# Patient Record
Sex: Female | Born: 1950 | Race: White | Hispanic: No | Marital: Married | Smoking: Former smoker
Health system: Southern US, Community
[De-identification: ages and names within clinical notes are randomized; demographics above are authoritative.]

## PROBLEM LIST (undated history)

## (undated) DIAGNOSIS — E785 Hyperlipidemia, unspecified: Secondary | ICD-10-CM

## (undated) DIAGNOSIS — I10 Essential (primary) hypertension: Secondary | ICD-10-CM

## (undated) DIAGNOSIS — E039 Hypothyroidism, unspecified: Secondary | ICD-10-CM

## (undated) DIAGNOSIS — M810 Age-related osteoporosis without current pathological fracture: Secondary | ICD-10-CM

## (undated) DIAGNOSIS — F419 Anxiety disorder, unspecified: Secondary | ICD-10-CM

## (undated) DIAGNOSIS — F32A Depression, unspecified: Secondary | ICD-10-CM

## (undated) DIAGNOSIS — Z9289 Personal history of other medical treatment: Secondary | ICD-10-CM

## (undated) DIAGNOSIS — F329 Major depressive disorder, single episode, unspecified: Secondary | ICD-10-CM

## (undated) DIAGNOSIS — E559 Vitamin D deficiency, unspecified: Secondary | ICD-10-CM

## (undated) HISTORY — DX: Vitamin D deficiency, unspecified: E55.9

## (undated) HISTORY — DX: Anxiety disorder, unspecified: F41.9

## (undated) HISTORY — PX: BACK SURGERY: SHX140

## (undated) HISTORY — DX: Essential (primary) hypertension: I10

## (undated) HISTORY — DX: Hypothyroidism, unspecified: E03.9

## (undated) HISTORY — DX: Age-related osteoporosis without current pathological fracture: M81.0

## (undated) HISTORY — DX: Major depressive disorder, single episode, unspecified: F32.9

## (undated) HISTORY — DX: Depression, unspecified: F32.A

## (undated) HISTORY — DX: Personal history of other medical treatment: Z92.89

## (undated) HISTORY — DX: Hyperlipidemia, unspecified: E78.5

---

## 2002-07-04 ENCOUNTER — Encounter: Payer: Self-pay | Admitting: Family Medicine

## 2002-07-04 ENCOUNTER — Encounter: Admission: RE | Admit: 2002-07-04 | Discharge: 2002-07-04 | Payer: Self-pay | Admitting: Family Medicine

## 2004-11-08 ENCOUNTER — Inpatient Hospital Stay (HOSPITAL_COMMUNITY): Admission: EM | Admit: 2004-11-08 | Discharge: 2004-11-10 | Payer: Self-pay | Admitting: Emergency Medicine

## 2005-08-14 ENCOUNTER — Other Ambulatory Visit: Admission: RE | Admit: 2005-08-14 | Discharge: 2005-08-14 | Payer: Self-pay | Admitting: Family Medicine

## 2005-12-06 ENCOUNTER — Encounter: Admission: RE | Admit: 2005-12-06 | Discharge: 2005-12-06 | Payer: Self-pay | Admitting: Family Medicine

## 2007-01-23 ENCOUNTER — Other Ambulatory Visit: Admission: RE | Admit: 2007-01-23 | Discharge: 2007-01-23 | Payer: Self-pay | Admitting: Family Medicine

## 2008-03-10 ENCOUNTER — Encounter: Admission: RE | Admit: 2008-03-10 | Discharge: 2008-03-10 | Payer: Self-pay | Admitting: Family Medicine

## 2008-04-22 ENCOUNTER — Other Ambulatory Visit: Admission: RE | Admit: 2008-04-22 | Discharge: 2008-04-22 | Payer: Self-pay | Admitting: Family Medicine

## 2009-09-01 ENCOUNTER — Other Ambulatory Visit: Admission: RE | Admit: 2009-09-01 | Discharge: 2009-09-01 | Payer: Self-pay | Admitting: Family Medicine

## 2010-03-02 ENCOUNTER — Emergency Department (HOSPITAL_COMMUNITY): Admission: EM | Admit: 2010-03-02 | Discharge: 2010-03-03 | Payer: Self-pay | Admitting: Emergency Medicine

## 2010-04-24 ENCOUNTER — Emergency Department (HOSPITAL_COMMUNITY): Admission: EM | Admit: 2010-04-24 | Discharge: 2010-04-24 | Payer: Self-pay | Admitting: Emergency Medicine

## 2010-04-26 DIAGNOSIS — J449 Chronic obstructive pulmonary disease, unspecified: Secondary | ICD-10-CM

## 2010-04-26 DIAGNOSIS — I10 Essential (primary) hypertension: Secondary | ICD-10-CM | POA: Insufficient documentation

## 2010-04-26 DIAGNOSIS — E0590 Thyrotoxicosis, unspecified without thyrotoxic crisis or storm: Secondary | ICD-10-CM | POA: Insufficient documentation

## 2010-04-26 DIAGNOSIS — J4489 Other specified chronic obstructive pulmonary disease: Secondary | ICD-10-CM | POA: Insufficient documentation

## 2010-04-26 DIAGNOSIS — E785 Hyperlipidemia, unspecified: Secondary | ICD-10-CM

## 2010-04-27 ENCOUNTER — Encounter: Payer: Self-pay | Admitting: Pulmonary Disease

## 2010-04-27 ENCOUNTER — Ambulatory Visit: Payer: Self-pay | Admitting: Pulmonary Disease

## 2010-04-27 DIAGNOSIS — R519 Headache, unspecified: Secondary | ICD-10-CM | POA: Insufficient documentation

## 2010-04-27 DIAGNOSIS — R51 Headache: Secondary | ICD-10-CM

## 2010-04-27 DIAGNOSIS — J309 Allergic rhinitis, unspecified: Secondary | ICD-10-CM

## 2010-04-27 DIAGNOSIS — G4710 Hypersomnia, unspecified: Secondary | ICD-10-CM | POA: Insufficient documentation

## 2010-05-04 ENCOUNTER — Encounter: Payer: Self-pay | Admitting: Pulmonary Disease

## 2010-05-19 ENCOUNTER — Ambulatory Visit: Payer: Self-pay | Admitting: Pulmonary Disease

## 2010-06-09 ENCOUNTER — Telehealth: Payer: Self-pay | Admitting: Pulmonary Disease

## 2010-08-28 DIAGNOSIS — Z9289 Personal history of other medical treatment: Secondary | ICD-10-CM

## 2010-08-28 HISTORY — DX: Personal history of other medical treatment: Z92.89

## 2010-09-08 ENCOUNTER — Other Ambulatory Visit
Admission: RE | Admit: 2010-09-08 | Discharge: 2010-09-08 | Payer: Self-pay | Source: Home / Self Care | Admitting: Family Medicine

## 2010-09-27 NOTE — Progress Notes (Signed)
Summary: nos appt  Phone Note Call from Patient   Caller: [email or web address]  Call For: Elizabeth Newman Summary of Call: LMTCB x2 to rsc nos from 10/12. Initial call taken by: Elizabeth Newman,  June 09, 2010 3:36 PM

## 2010-09-27 NOTE — Miscellaneous (Signed)
Summary: Orders Update pft charges  Clinical Lists Changes  Orders: Added new Service order of Carbon Monoxide diffusing w/capacity (94720) - Signed Added new Service order of Lung Volumes (94240) - Signed Added new Service order of Spirometry (Pre & Post) (94060) - Signed 

## 2010-09-27 NOTE — Miscellaneous (Signed)
Summary: Pulmonary function test   Pulmonary Function Test Date: 04/27/2010 Height (in.): 65 Gender: Female  Pre-Spirometry FVC    Value: 3.41 L/min   Pred: 3.26 L/min     % Pred: 105 % FEV1    Value: 2.06 L     Pred: 2.43 L     % Pred: 85 % FEV1/FVC  Value: 60 %     Pred: 74 %     % Pred: . % FEF 25-75  Value: 0.91 L/min   Pred: 2.74 L/min     % Pred: 33 %  Post-Spirometry FVC    Value: 3.18 L/min   Pred: 3.26 L/min     % Pred: 98 % FEV1    Value: 2.17 L     Pred: 2.43 L     % Pred: 90 % FEV1/FVC  Value: 68 %     Pred: 74 %     % Pred: . % FEF 25-75  Value: 1.16 L/min   Pred: 2.74 L/min     % Pred: 42 %  Lung Volumes TLC    Value: 5.48 L   % Pred: 107 % RV    Value: 2.07 L   % Pred: 109 % DLCO    Value: 16.8 %   % Pred: 81 % DLCO/VA  Value: 3.42 %   % Pred: 89 %  Comments: Mild obstruction.  Positive bronchodilator response.  Normal lung volumes.  Normal diffusion capacity. Clinical Lists Changes  Observations: Added new observation of PFT COMMENTS: Mild obstruction.  Positive bronchodilator response.  Normal lung volumes.  Normal diffusion capacity. (04/27/2010 9:35) Added new observation of DLCO/VA%EXP: 89 % (04/27/2010 9:35) Added new observation of DLCO/VA: 3.42 % (04/27/2010 9:35) Added new observation of DLCO % EXPEC: 81 % (04/27/2010 9:35) Added new observation of DLCO: 16.8 % (04/27/2010 9:35) Added new observation of RV % EXPECT: 109 % (04/27/2010 9:35) Added new observation of RV: 2.07 L (04/27/2010 9:35) Added new observation of TLC % EXPECT: 107 % (04/27/2010 9:35) Added new observation of TLC: 5.48 L (04/27/2010 9:35) Added new observation of FEF2575%EXPS: 42 % (04/27/2010 9:35) Added new observation of PSTFEF25/75P: 2.74  (04/27/2010 9:35) Added new observation of PSTFEF25/75%: 1.16 L/min (04/27/2010 9:35) Added new observation of PSTFEV1/FCV%: . % (04/27/2010 9:35) Added new observation of FEV1FVCPRDPS: 74 % (04/27/2010 9:35) Added new observation of  PSTFEV1/FVC: 68 % (04/27/2010 9:35) Added new observation of POSTFEV1%PRD: 90 % (04/27/2010 9:35) Added new observation of FEV1PRDPST: 2.43 L (04/27/2010 9:35) Added new observation of POST FEV1: 2.17 L/min (04/27/2010 9:35) Added new observation of POST FVC%EXP: 98 % (04/27/2010 9:35) Added new observation of FVCPRDPST: 3.26 L/min (04/27/2010 9:35) Added new observation of POST FVC: 3.18 L (04/27/2010 9:35) Added new observation of FEF % EXPEC: 33 % (04/27/2010 9:35) Added new observation of FEF25-75%PRE: 2.74 L/min (04/27/2010 9:35) Added new observation of FEF 25-75%: 0.91 L/min (04/27/2010 9:35) Added new observation of FEV1/FVC%EXP: . % (04/27/2010 9:35) Added new observation of FEV1/FVC PRE: 74 % (04/27/2010 9:35) Added new observation of FEV1/FVC: 60 % (04/27/2010 9:35) Added new observation of FEV1 % EXP: 85 % (04/27/2010 9:35) Added new observation of FEV1 PREDICT: 2.43 L (04/27/2010 9:35) Added new observation of FEV1: 2.06 L (04/27/2010 9:35) Added new observation of FVC % EXPECT: 105 % (04/27/2010 9:35) Added new observation of FVC PREDICT: 3.26 L (04/27/2010 9:35) Added new observation of FVC: 3.41 L (04/27/2010 9:35) Added new observation of PFT HEIGHT: 65  (04/27/2010 9:35) Added new observation of  PFT DATE: 04/27/2010  (04/27/2010 9:35)

## 2010-09-27 NOTE — Assessment & Plan Note (Signed)
Summary: copd/sob/cb   Visit Type:  Initial Consult Copy to:  Dr. Maurice Small Primary Provider/Referring Provider:  Maurice Small, M.D.  CC:  Pulmonary consult.  Marland Kitchen  History of Present Illness: 60 yo female for evaluation of dyspnea and asthma.  She gets episodes of bronchitis every year.  She had pneumonia previously.  She has experienced more trouble this past summer.  She relates this to stress at work and with her father recently passing.  She was seen in the ED in July for breathing problems.  She then smoked a few cigarettes over the past weekend after attending her father's funeral.  This triggered her symptoms, and she went back to the emergency room.  She was given ventolin and prednisone.  She has since improved.  As a result of her frequent symptoms pulmonary consultation was requested.  She gets cough with green sputum and wheeze.  This happens when she is around dogs or cats.  She also gets this when exposed to cut grass or during the Spring.  She gets tightness in her chest, and feels like she can't get air into her lungs.  She notices this also when exposed to strong smells or fumes.  She gets lots of problems with her sinuses and has post-nasal drip.  She does get hoarse occasionally.  She has been getting some reflux.    She works in data entry.  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  She has lived in [place] her whole life.  She travelled to [place] during the summer, but did not have any trouble.  She has a Development worker, international aid.  She denies other animal exposures.  She denies sick exposures.  There is no history of TB.  She started smoking at age 22.  She smoked 1 pack per day until age 68.  She had a few cigarettes this past weekend after her father's funeral.  She has not smoked since she was seen in the emergency room  She gets frequent headaches during the day.  She falls asleep easily when sitting quiet.  She snores at  night, and gets a dry mouth.  She has a history of hypertension.  Labs April 24, 2010:  WBC                                      9.0               4.0-10.5         K/uL  RBC                                      4.30              3.87-5.11        MIL/uL  Hemoglobin (HGB)                         13.8              12.0-15.0        g/dL  Hematocrit (HCT)                         41.0  36.0-46.0        %  MCV                                      95.4              78.0-100.0       fL  MCH -                                    32.2              26.0-34.0        pg  MCHC                                     33.8              30.0-36.0        g/dL  RDW                                      13.9              11.5-15.5        %  Platelet Count (PLT)                     219               150-400          K/uL  TCO2                                     28                0-100            mmol/L  Ionized Calcium                          1.24              1.12-1.32        mmol/L  Hemoglobin (HGB)                         15.0              12.0-15.0        g/dL  Hematocrit (HCT)                         44.0              36.0-46.0        %  Sodium (NA)                              142               135-145          mEq/L  Potassium (K)  3.5               3.5-5.1          mEq/L  Chloride                                 106               96-112           mEq/L  Glucose                                  143        h      70-99            mg/dL  BUN                                      11                6-23             mg/dL  Creatinine                               0.7               0.4-1.2          mg/dL  CXR  Procedure date:  03/02/2010  Findings:      CHEST - 2 VIEW    Comparison: Chest radiograph performed 11/08/2004    Findings: The lungs are well-aerated.  Apparent very mild left   basilar density is thought to reflect the patient's nipple shadow,   slightly more  superior than on the prior study, as no definite   pneumonia is identified on the lateral view.  There is no evidence   of focal consolidation, pleural effusion or pneumothorax.    The heart is normal in size; the mediastinal contour is within   normal limits.  No acute osseous abnormalities are seen.    IMPRESSION:   No acute cardiopulmonary process seen.  CXR  Procedure date:  04/24/2010  Findings:        CHEST - 2 VIEW    Comparison: 03/02/2010    Findings: Heart and mediastinal contours are within normal limits.   No focal opacities or effusions.  No acute bony abnormality.    IMPRESSION:   No active disease.   Preventive Screening-Counseling & Management  Alcohol-Tobacco     Smoking Status: current     Packs/Day: 1.0     Year Started: 1971  Current Medications (verified): 1)  Prednisone 10 Mg Tabs (Prednisone) .... Tapering Dose 2)  Levothroid 50 Mcg Tabs (Levothyroxine Sodium) .... Once Daily 3)  Ventolin Hfa 108 (90 Base) Mcg/act Aers (Albuterol Sulfate) .... 2 Purrs Every Four Hours As Needed 4)  Sertraline Hcl 50 Mg Tabs (Sertraline Hcl) .... Once Daily 5)  Simvastatin 40 Mg Tabs (Simvastatin) .... Once Daily  Allergies (verified): 1)  ! * Ivp Dyes  Past History:  Past Medical History: Hypothyroidism Hypertension Hyperlipidemia Asthma Allergic rhinitis  Past Surgical History: Back surgery in 1993 and 2000  Family History: Family History Asthma---mother and PGF Family History Emphysema ---mother Family History Hypertension---father Bone  cancer---MGF Heart disease---MGM and PGM Allergies---mother  Social History: Patient states former smoker.  Patient is a current smoker. 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  The patient denies non-productive cough, coughing up blood, irregular heartbeats, acid heartburn, loss of appetite, weight change, abdominal pain, difficulty swallowing, sore throat, tooth/dental problems, sneezing, itching, ear ache, depression, hand/feet swelling, joint stiffness or pain, rash, change in color of mucus, and fever.    Vital Signs:  Patient profile:   60 year old female Height:      65 inches (165.10 cm) Weight:      148.50 pounds (67.50 kg) BMI:     24.80 O2 Sat:      94 % on Room air Temp:     98.2 degrees F (36.78 degrees C) oral Pulse rate:   82 / minute BP sitting:   162 / 84  (right arm) Cuff size:   regular  Vitals Entered By: Michel Bickers CMA (April 27, 2010 10:24 AM)  O2 Sat at Rest %:  94 O2 Flow:  Room air CC: Pulmonary consult.   Is Patient Diabetic? No Comments Medications reviewed with the patient. Daytime phone verified. Michel Bickers CMA  April 27, 2010 10:29 AM   Physical Exam  General:  normal appearance and healthy appearing.   Eyes:  PERRLA and EOMI, wears glasses Ears:  TMs intact and clear with normal canals Nose:  narrow nasal angles, pale mucosa, clear drainage, no tenderness Mouth:  MP 3, enlarged tongue, slight over-bite Neck:  no thyromegaly Chest Wall:  no deformities noted Lungs:  coarse breath sounds, diminished breath sounds, no wheeze or rales, no dullness Heart:  regular rate and rhythm, S1, S2 without murmurs, rubs, gallops, or clicks Abdomen:  bowel sounds positive; abdomen soft and non-tender without masses, or organomegaly Msk:  no deformity or scoliosis noted with normal posture Pulses:  pulses normal Extremities:  no clubbing, cyanosis, edema, or deformity noted Neurologic:  normal CN II-XII and strength normal.   Skin:  no rashes Cervical Nodes:  no significant adenopathy Psych:  anxious.     Impression & Recommendations:  Problem # 1:  ASTHMA  (ICD-493.90) Her symptoms are suggestive of asthma.  She had recent unremarkable chest xray.  She does have a history of tobacco use.  I will have her undergo pulmonary function testing to further assess whether she has obstructive lung disease.  I have advised her to finish her course of prednisone from the emergency room.  I do not think that she needs antibiotics.  I will start her on Qvar two puffs two times a day.  She is to continue with ventolin two puffs as needed.    Depending on her symptoms response, and results of her PFT, will decide if she needs augmentation of her inhaler regimen.  Problem # 2:  ALLERGIC RHINITIS (ICD-477.9)  She does have symptoms of allergies and chronic rhinitis.  This is likely contributing to her respiratory problems.  I will give her a trial of nasal irrigation and nasonex.  She can use OTC anti-histamines as needed.  Depending on her response she may need additional allergy testing.  Problem # 3:  HYPERSOMNIA (ICD-780.54)  She has sleep disruption, and daytime sleepiness.  She has a history of hypertension.  She has physical findings suggestive of sleep apnea.  I have advised her to monitor her sleep pattern as her respiratory problems hopefully improve.  Advised her that she may need to have a sleep test to further evaluate for sleep apnea.  Medications Added to Medication List This Visit: 1)  Prednisone 10 Mg Tabs (Prednisone) .... Tapering dose 2)  Levothroid 50 Mcg Tabs (Levothyroxine sodium) .... Once daily 3)  Ventolin Hfa 108 (90 Base) Mcg/act Aers (Albuterol sulfate) .... 2 puffs four times per day as needed 4)  Sertraline Hcl 50 Mg Tabs (Sertraline hcl) .... Once daily 5)  Simvastatin 40 Mg Tabs (Simvastatin) .... Once daily 6)  Qvar 80 Mcg/act Aers (Beclomethasone dipropionate) .... Two puffs two times a day 7)  Nasonex 50 Mcg/act Susp (Mometasone furoate) .... Two puffs once daily  Complete Medication List: 1)  Prednisone 10 Mg Tabs  (Prednisone) .... Tapering dose 2)  Levothroid 50 Mcg Tabs (Levothyroxine sodium) .... Once daily 3)  Ventolin Hfa 108 (90 Base) Mcg/act Aers (Albuterol sulfate) .... 2 puffs four times per day as needed 4)  Sertraline Hcl 50 Mg Tabs (Sertraline hcl) .... Once daily 5)  Simvastatin 40 Mg Tabs (Simvastatin) .... Once daily 6)  Qvar 80 Mcg/act Aers (Beclomethasone dipropionate) .... Two puffs two times a day 7)  Nasonex 50 Mcg/act Susp (Mometasone furoate) .... Two puffs once daily  Other Orders: Consultation Level V (70350) Full Pulmonary Function Test (PFT)  Patient Instructions: 1)  Qvar two puffs two times a day, and rinse mouth after using 2)  Ventolin two puffs up to four times per day as needed for cough, wheeze, chest congestion, or shortness of breath 3)  Finish course of prednisone 4)  Nasal irrigation (salt water sinus spray) once daily  5)  Nasonex two sprays once daily  6)  Follow up in 6 to 8 weeks Prescriptions: NASONEX 50 MCG/ACT SUSP (MOMETASONE FUROATE) two puffs once daily  #1 x 6   Entered and Authorized by:   Coralyn Helling MD   Signed by:   Coralyn Helling MD on 04/27/2010   Method used:   Electronically to        [facility]  [address] [phone]* (retail)       [address]       [place], [place]  [address]       Ph: [phone]       Fax: [phone]   RxID:   [id number] QVAR 80 MCG/ACT AERS (BECLOMETHASONE DIPROPIONATE) two puffs two times a day  #1 x 6   Entered and Authorized by:   Coralyn Helling MD   Signed by:   Coralyn Helling MD on 04/27/2010   Method used:   Electronically to        [facility]  [address] [phone]* (retail)       [address]       [place], [place]  [address]       Ph: [phone]       Fax: [phone]   RxID:   [id number]

## 2010-09-27 NOTE — Assessment & Plan Note (Signed)
Summary: breathing issues/cb   Visit Type:  Follow-up Copy to:  Dr. Maurice Small Primary Provider/Referring Provider:  Maurice Small, M.D.  CC:  The patient c/o increased SOB this morning with wheezing and chest tightness...occasional cough with white mucus.  History of Present Illness: 60 yo female with chronic obstructive asthma, rhinitis, hypersomnia  She had trouble breathing this morning.  This was associated with chest tightness and wheezing.  She was having congestion in her sinuses.  She was not having cough, sputum, or fever.  Her throat was scratchy.  She did not have rash, swelling, or abdominal symptoms.  She has been off prednisone for several weeks.  She has been using Qvar two times a day.  She was not using ventolin much before this morning.  She does not recall any specific exposure which could have triggered her symptoms this morning.  She is feeling better now.   Current Medications (verified): 1)  Levothroid 50 Mcg Tabs (Levothyroxine Sodium) .... Once Daily 2)  Ventolin Hfa 108 (90 Base) Mcg/act Aers (Albuterol Sulfate) .... 2 Puffs Four Times Per Day As Needed 3)  Sertraline Hcl 50 Mg Tabs (Sertraline Hcl) .... Once Daily 4)  Simvastatin 40 Mg Tabs (Simvastatin) .... Once Daily 5)  Qvar 80 Mcg/act Aers (Beclomethasone Dipropionate) .... Two Puffs Two Times A Day 6)  Nasonex 50 Mcg/act Susp (Mometasone Furoate) .... Two Puffs Once Daily  Allergies (verified): 1)  ! * Ivp Dyes  Past History:  Past Medical History: Hypothyroidism Hypertension Hyperlipidemia Chronic obstructive asthma      - PFT 04/27/10 FEV1 2.17(90%), FEV1% 68, TLC 5.48(107%), DLCO 81%, +BD response Allergic rhinitis  Past Surgical History: Reviewed history from 04/27/2010 and no changes required. Back surgery in 1993 and 2000  Vital Signs:  Patient profile:   60 year old female Height:      65 inches (165.10 cm) Weight:      148.13 pounds (67.33 kg) BMI:     24.74 O2 Sat:      94  % on Room air Temp:     98.0 degrees F (36.67 degrees C) oral Pulse rate:   91 / minute BP sitting:   128 / 80  (left arm) Cuff size:   regular  Vitals Entered By: Michel Bickers CMA (May 19, 2010 3:04 PM)  O2 Sat at Rest %:  94 O2 Flow:  Room air CC: The patient c/o increased SOB this morning with wheezing and chest tightness...occasional cough with white mucus Comments Medications reviewed with patient Daytime phone verified. Michel Bickers [facility]  May 19, 2010 3:05 PM   Physical Exam  General:  normal appearance and healthy appearing.   Ears:  mild cerumen build up b/l Nose:  narrow nasal angles, pale mucosa, clear drainage, no tenderness Mouth:  MP 3, enlarged tongue, slight over-bite Neck:  no thyromegaly Lungs:  coarse breath sounds, diminished breath sounds, no wheeze or rales, no dullness Heart:  regular rate and rhythm, S1, S2 without murmurs, rubs, gallops, or clicks Extremities:  no clubbing, cyanosis, edema, or deformity noted Neurologic:  normal CN II-XII and strength normal.   Cervical Nodes:  no significant adenopathy   Impression & Recommendations:  Problem # 1:  ASTHMA (ICD-493.90) She had an acute flare this morning.  She has improved after ventolin treatment.  I don't think she needs antibiotics, prednisone, or a chest xray at this time.  I will give her a sample of symbicort which she is to use until finished, and then  resume Qvar.  Advised her that she will need a flu shot once her current symptoms resolve.  Problem # 2:  ALLERGIC RHINITIS (ICD-477.9) She is to continue nasonex and nasal irrigation.  Problem # 3:  HYPERSOMNIA (ICD-780.54) Will re-assess at next visit.  Medications Added to Medication List This Visit: 1)  Symbicort 160-4.5 Mcg/act Aero (Budesonide-formoterol fumarate) .... Two puffs two times a day for 2 weeks  Complete Medication List: 1)  Levothroid 50 Mcg Tabs (Levothyroxine sodium) .... Once daily 2)  Ventolin Hfa 108 (90  Base) Mcg/act Aers (Albuterol sulfate) .... 2 puffs four times per day as needed 3)  Sertraline Hcl 50 Mg Tabs (Sertraline hcl) .... Once daily 4)  Simvastatin 40 Mg Tabs (Simvastatin) .... Once daily 5)  Qvar 80 Mcg/act Aers (Beclomethasone dipropionate) .... Two puffs two times a day 6)  Nasonex 50 Mcg/act Susp (Mometasone furoate) .... Two puffs once daily 7)  Symbicort 160-4.5 Mcg/act Aero (Budesonide-formoterol fumarate) .... Two puffs two times a day for 2 weeks  Other Orders: Est. Patient Level III (32951)  Patient Instructions: 1)  Symbicort two puffs two times a day for two weeks, then resume Qvar. 2)  Call if symptoms do not improve 3)  Follow up in 4 months

## 2010-11-11 LAB — CBC
HCT: 41 % (ref 36.0–46.0)
MCH: 32.2 pg (ref 26.0–34.0)
MCHC: 33.8 g/dL (ref 30.0–36.0)
MCV: 95.4 fL (ref 78.0–100.0)
Platelets: 219 10*3/uL (ref 150–400)
RDW: 13.9 % (ref 11.5–15.5)
WBC: 9 10*3/uL (ref 4.0–10.5)

## 2010-11-11 LAB — POCT I-STAT, CHEM 8
Calcium, Ion: 1.24 mmol/L (ref 1.12–1.32)
Glucose, Bld: 143 mg/dL — ABNORMAL HIGH (ref 70–99)
HCT: 44 % (ref 36.0–46.0)
Hemoglobin: 15 g/dL (ref 12.0–15.0)
TCO2: 28 mmol/L (ref 0–100)

## 2010-11-13 LAB — CBC
MCV: 94.4 fL (ref 78.0–100.0)
Platelets: 258 10*3/uL (ref 150–400)
RBC: 4.49 MIL/uL (ref 3.87–5.11)
RDW: 13.6 % (ref 11.5–15.5)
WBC: 11.3 10*3/uL — ABNORMAL HIGH (ref 4.0–10.5)

## 2010-11-13 LAB — DIFFERENTIAL
Basophils Absolute: 0 10*3/uL (ref 0.0–0.1)
Eosinophils Absolute: 0.9 10*3/uL — ABNORMAL HIGH (ref 0.0–0.7)
Eosinophils Relative: 8 % — ABNORMAL HIGH (ref 0–5)
Lymphocytes Relative: 22 % (ref 12–46)
Lymphs Abs: 2.5 10*3/uL (ref 0.7–4.0)
Neutrophils Relative %: 62 % (ref 43–77)

## 2010-11-13 LAB — BASIC METABOLIC PANEL
BUN: 11 mg/dL (ref 6–23)
Calcium: 9.8 mg/dL (ref 8.4–10.5)
Creatinine, Ser: 0.5 mg/dL (ref 0.4–1.2)
GFR calc Af Amer: >60 mL/min (ref >60)

## 2011-01-13 NOTE — Discharge Summary (Signed)
Elizabeth Newman, Elizabeth Newman NO.:  [id number]   MEDICAL RECORD NO.:  [id number]          PATIENT TYPE:  INP   LOCATION:  4712                         FACILITY:  [facility]   PHYSICIAN:  Madaline Savage, M.D.DATE OF BIRTH:  September 20, 1950   DATE OF ADMISSION:  11/08/2004  DATE OF DISCHARGE:  11/10/2004                                 DISCHARGE SUMMARY   DISCHARGE DIAGNOSES:  1.  Chest pain, negative myocardial infarction with normal coronary artery      __________.  2.  Upper respiratory infection.  3.  Positive tobacco use.  4.  Coronary disease in family history.   DISCHARGE MEDICATIONS:  1.  Toprol XL 25 mg daily.  2.  Enteric-coated aspirin 81 mg daily.  3.  Septra one p.o. twice a day for 12 more days.  4.  Nasonex spray one spray each nostril twice a day.  5.  Claritin 10 mg daily.   DISCHARGE INSTRUCTIONS:  Needs to stop smoking.   ACTIVITY:  No strenuous activity for three days, then resume regular  activities.   DIET:  Low fat, low salt diet.   WOUND CARE:  May shower, no tub baths or swimming for one week.  If any  swelling, drainage, or fever, please call our office.   FOLLOW UP:  Follow up with primary physician.   HISTORY OF PRESENT ILLNESS:  The patient presented to the ER November 08, 2004  after several days of chest pain.  On November 05, 2004 she was in her kitchen  and all of a sudden felt severe pressure, heavy weight on her chest.  She  was unsure if she was short of breath as well because she is having a cold  at the time of the initial admission.  She could not decide if her pressure  came from her heart or her chest congestion.  She felt weak, slightly  nauseated, went to bed, after taking ibuprofen, two tablets, and slept from  12 p.m. on Saturday until 6 p.m. Sunday.  She was able to go to work on  Monday but felt extremely weak.  Today, November 08, 2004, she again developed  another episode of chest pain, went to [facility], and was referred to  [facility]  ED for evaluation.   In the ER she was pain free after she received a breathing treatment at  [facility], though she did have two episodes of chest pain since that time.   PAST MEDICAL HISTORY:  1.  Only back surgery.  2.  She denied hypertension, diabetes, or stroke.   FAMILY HISTORY/SOCIAL HISTORY/REVIEW OF SYSTEMS:  See H&P.   ALLERGIES:  IODINE.   INPATIENT MEDICATIONS:  None.   PHYSICAL EXAMINATION:  VITAL SIGNS:  At discharge, blood pressure 156/86,  pulse 59, respirations 22, temperature 98, oxygen saturation on room air  91%.  HEART:  S1/S2, regular rate and rhythm.  LUNGS:  Clear.  ABDOMEN:  Soft, nontender, positive bowel sounds.  EXTREMITIES:  Right groin stable without hematoma.  She did have  angio__________.  There was 2+ pedal edema.   LABORATORY DATA:  Hemoglobin 16, hematocrit 45, WBC 5.6, platelets 195.  Pro  time 12.2, INR 0.9, PTT 30.   Chemistries:  Sodium 140, potassium 4.1, chloride 105, CO2 27, glucose 100,  BUN 5, creatinine 0.6, calcium 9.5.  Total protein 7.3, albumin 3.9.  AST  30, ALT 29, ALP 82.  Total bilirubin 0.5, magnesium 2.1.   Cardiac enzymes:  CKs 72-77, MBs 1.1-0.8, troponin I 0.01-2.   Cholesterol 154, triglycerides 72, HDL 39, and LDL 101.   TSH 5.449.   Chest x-ray:  No active cardiopulmonary disease.  EKG:  Sinus rhythm, normal  EKG.  Cardiac cath done November 09, 2004:  Coronaries and LV function were  normal.  No complications.  Successful angio film.   HOSPITAL COURSE:  [name] was admitted by Dr. Elsie Lincoln November 08, 2004  after she had experienced chest pain twice over the weekend.  She was  admitted and ruled out for MI, and underwent cardiac catheterization.  It  was negative for coronary disease.  It was thought chest pain was related to  upper respiratory infection.  She was discharged home and would follow up as  an outpatient with primary care.      LRI/MEDQ  D:  01/09/2005  T:  01/09/2005  Job:  [id number]    cc:   Madaline Savage, M.D.  [address]  [place]  [place] [address]  Fax: [phone]   [facility]

## 2011-01-13 NOTE — Cardiovascular Report (Signed)
Elizabeth Newman, JIMERSON NO.:  [id number]   MEDICAL RECORD NO.:  [id number]          PATIENT TYPE:  INP   LOCATION:  4712                         FACILITY:  [facility]   PHYSICIAN:  Madaline Savage, M.D.DATE OF BIRTH:  May 04, 1951   DATE OF PROCEDURE:  11/09/2004  DATE OF DISCHARGE:                              CARDIAC CATHETERIZATION   PROCEDURES PERFORMED:  1.  Selective coronary angiography by Judkins technique.  2.  Retrograde left heart catheterization.  3.  Left ventricular angiography.  4.  Percutaneous AngioSeal closure of the right femoral artery.   COMPLICATIONS:  None.   ENTRY SITE:  Right femoral.   DYE USED:  Omnipaque.   PATIENT PROFILE:  The patient is a 60 year old white female with a chronic  tobacco use history who went to an urgent care center yesterday with  complaints of sinusitis and when being evaluated reported that she had had  some chest wall work of breathing tight symptoms.  An EKG was ordered and  showed some mild ST-segment elevation diffusely that looked, to my eye, like  early repolarization in retrospect; but the patient care providers from the  Urgent Care Center sent her to the [facility] Emergency Room.  I tried to  perform cardiac catheterization, yesterday, to reassure the patient and the  outlying MD that the coronaries were normal, but the schedule disallowed her  from being catheterized, yesterday, so she presents today for a cardiac  catheterization. Cardiac enzymes have been negative. The patient has no  chest pain and never really did.  She did have increased work of breathing.  I started her on agents for sinusitis which included Bactrim double strength  1 tablet twice a day for 14 days, Clarinex, and Nasonex nasal spray; and she  feels better today.   RESULTS:  Pressures:  The left ventricular pressure was 150/5, end-diastolic  pressure 12. Central aortic pressure:  145/75, mean of 100, no aortic valve  gradient by  pullback technique. Coronary arteries were angiographically  patent throughout with a right coronary dominant system.  The patient's left  ventricular angiogram showed normal contractility, normal wall motion, and a  left ventricular ejection fraction of 65%. Abdominal aortography was not  performed.  Right femoral arteriography was performed in order to allow the  patient to have an AngioSeal placed; and that device was placed after  confirming the favorability for the position of the sheath in the right  femoral artery.   FINAL DIAGNOSIS:  1.  Angiographically patent coronary arteries.  2.  Normal left ventricular systolic function.  3.  Successful right percutaneous femoral artery AngioSeal.   PLAN:  Discharge in 2 hours.      WHG/MEDQ  D:  11/09/2004  T:  11/09/2004  Job:  [id number]   cc:   Madaline Savage, M.D.  [address]  [place]  [place] [address]  Fax: [phone]   [facility]

## 2011-03-06 ENCOUNTER — Encounter: Payer: Self-pay | Admitting: Adult Health

## 2011-03-06 ENCOUNTER — Ambulatory Visit (INDEPENDENT_AMBULATORY_CARE_PROVIDER_SITE_OTHER): Payer: 59 | Admitting: Adult Health

## 2011-03-06 VITALS — BP 150/90 | HR 94 | Temp 97.7°F | Ht 65.5 in | Wt 155.2 lb

## 2011-03-06 DIAGNOSIS — J45909 Unspecified asthma, uncomplicated: Secondary | ICD-10-CM

## 2011-03-06 MED ORDER — AMOXICILLIN-POT CLAVULANATE 875-125 MG PO TABS: 1.0000 | ORAL_TABLET | Freq: Two times a day (BID) | ORAL | Status: AC

## 2011-03-06 MED ORDER — VARENICLINE TARTRATE 1 MG PO TABS: 1.0000 mg | ORAL_TABLET | Freq: Two times a day (BID) | ORAL | Status: AC

## 2011-03-06 MED ORDER — VARENICLINE TARTRATE 0.5 MG X 11 & 1 MG X 42 PO MISC: ORAL | Status: AC

## 2011-03-06 MED ORDER — PREDNISONE 10 MG PO TABS: ORAL_TABLET | ORAL | Status: AC

## 2011-03-06 MED ORDER — HYDROCODONE-HOMATROPINE 5-1.5 MG/5ML PO SYRP: 5.0000 mL | ORAL_SOLUTION | Freq: Four times a day (QID) | ORAL | Status: AC | PRN

## 2011-03-06 NOTE — Patient Instructions (Signed)
Augmentin 875mg  Twice daily  For 7 days -take with food Mucinex DM Twice daily   Prednisone taper over next week Fluids and rest  Hydromet 1-2 tsp every 4-6 hr As needed  Cough, may make you sleepy Chantix for quitting smoking.  Please contact office for sooner follow up if symptoms do not improve or worsen or seek emergency care  follow up Dr. Craige Cotta  In 6 weeks and As needed

## 2011-03-07 MED ORDER — ALBUTEROL SULFATE HFA 108 (90 BASE) MCG/ACT IN AERS: 2.0000 | INHALATION_SPRAY | Freq: Four times a day (QID) | RESPIRATORY_TRACT | Status: DC | PRN

## 2011-03-14 NOTE — Assessment & Plan Note (Signed)
Asthmatic bronchitic exacerbation   Plan  Augmentin 875mg  Twice daily  For 7 days -take with food Mucinex DM Twice daily   Prednisone taper over next week Fluids and rest  Hydromet 1-2 tsp every 4-6 hr As needed  Cough, may make you sleepy Chantix for quitting smoking.  Please contact office for sooner follow up if symptoms do not improve or worsen or seek emergency care  follow up Dr. Craige Cotta  In 6 weeks and As needed

## 2011-03-14 NOTE — Progress Notes (Signed)
  Subjective:    Patient ID: Elizabeth Newman, female    DOB: 1951/06/19, 60 y.o.   MRN: [id number]  HPI 60 yo female with hx of Asthma , active smoker 03/06/11 Acute OV  Pt presents for an acute office visit. Complains of prod cough with green/brown mucus, wheezing, increased SOB this am.  OTC not helping. No fever or chest pain. No edema. No recent travel or abx use.  Increased SABA use today .  We discussed smoking cesstation . She would like to try Chantix.    Review of Systems Constitutional:   No  weight loss, night sweats,  Fevers, chills, fatigue, or  lassitude.  HEENT:   No headaches,  Difficulty swallowing,  Tooth/dental problems, or  Sore throat,                No sneezing, itching, ear ache, nasal congestion, post nasal drip,   CV:  No chest pain,  Orthopnea, PND, swelling in lower extremities, anasarca, dizziness, palpitations, syncope.   GI  No heartburn, indigestion, abdominal pain, nausea, vomiting, diarrhea, change in bowel habits, loss of appetite, bloody stools.   Resp:   No coughing up of blood.  Marland Kitchen  No chest wall deformity  Skin: no rash or lesions.  GU: no dysuria, change in color of urine, no urgency or frequency.  No flank pain, no hematuria   MS:  No joint pain or swelling.  No decreased range of motion.  No back pain.  Psych:  No change in mood or affect. No depression or anxiety.  No memory loss.          Objective:   Physical Exam GEN: A/Ox3; pleasant , NAD  HEENT:  [place]/AT,  EACs-clear, TMs-wnl, NOSE-clear, THROAT-clear, no lesions, no postnasal drip or exudate noted.   NECK:  Supple w/ fair ROM; no JVD; normal carotid impulses w/o bruits; no thyromegaly or nodules palpated; no lymphadenopathy.  RESP  Coarse BS w/ faint exp wheeze no accessory muscle use, no dullness to percussion  CARD:  RRR, no m/r/g  , no peripheral edema, pulses intact, no cyanosis or clubbing.  GI:   Soft & nt; nml bowel sounds; no organomegaly or masses  detected.  Musco: Warm bil, no deformities or joint swelling noted.   Neuro: alert, no focal deficits noted.    Skin: Warm, no lesions or rashes         Assessment & Plan:

## 2011-04-10 ENCOUNTER — Ambulatory Visit (INDEPENDENT_AMBULATORY_CARE_PROVIDER_SITE_OTHER)
Admission: RE | Admit: 2011-04-10 | Discharge: 2011-04-10 | Disposition: A | Discharge status: Home / Self Care | Payer: 59 | Source: Ambulatory Visit | Attending: Adult Health | Admitting: Adult Health

## 2011-04-10 ENCOUNTER — Encounter: Payer: Self-pay | Admitting: Adult Health

## 2011-04-10 ENCOUNTER — Ambulatory Visit (INDEPENDENT_AMBULATORY_CARE_PROVIDER_SITE_OTHER): Payer: 59 | Admitting: Adult Health

## 2011-04-10 VITALS — BP 126/86 | HR 89 | Temp 98.0°F | Ht 65.5 in | Wt 154.4 lb

## 2011-04-10 DIAGNOSIS — R0600 Dyspnea, unspecified: Secondary | ICD-10-CM

## 2011-04-10 DIAGNOSIS — R0609 Other forms of dyspnea: Secondary | ICD-10-CM

## 2011-04-10 DIAGNOSIS — R0989 Other specified symptoms and signs involving the circulatory and respiratory systems: Secondary | ICD-10-CM

## 2011-04-10 DIAGNOSIS — J45909 Unspecified asthma, uncomplicated: Secondary | ICD-10-CM

## 2011-04-10 MED ORDER — PREDNISONE 10 MG PO TABS: ORAL_TABLET | ORAL | Status: AC

## 2011-04-10 MED ORDER — ALBUTEROL SULFATE (2.5 MG/3ML) 0.083% IN NEBU
2.5000 mg | INHALATION_SOLUTION | Freq: Once | RESPIRATORY_TRACT | Status: AC
  Administered 2011-04-10: 2.5 mg via RESPIRATORY_TRACT

## 2011-04-10 MED ORDER — MOMETASONE FURO-FORMOTEROL FUM 200-5 MCG/ACT IN AERO: 2.0000 | INHALATION_SPRAY | Freq: Two times a day (BID) | RESPIRATORY_TRACT | Status: DC

## 2011-04-10 MED ORDER — DOXYCYCLINE HYCLATE 100 MG PO CAPS: 100.0000 mg | ORAL_CAPSULE | Freq: Two times a day (BID) | ORAL | Status: AC

## 2011-04-10 MED ORDER — HYDROCODONE-HOMATROPINE 5-1.5 MG/5ML PO SYRP: ORAL_SOLUTION | ORAL | Status: DC

## 2011-04-10 NOTE — Patient Instructions (Addendum)
Stop QVAR  Begin Dulera 200 2 puffs Twice daily  , brush/rinse and gargle after use. Doxycycline 100mg   Twice daily  For 7 days -take with food Mucinex DM Twice daily   Prednisone taper over next week Fluids and rest  Hydromet 1-2 tsp every 4-6 hr As needed  Cough, may make you sleepy Please contact office for sooner follow up if symptoms do not improve or worsen or seek emergency care  follow up Dr. Craige Cotta  In 4  weeks and As needed

## 2011-04-11 NOTE — Assessment & Plan Note (Signed)
Recurrent asthmatic bronchitis Xray today with no acute process.   Plan Stop QVAR  Begin Dulera 200 2 puffs Twice daily  , brush/rinse and gargle after use. Doxycycline 100mg   Twice daily  For 7 days -take with food Mucinex DM Twice daily   Prednisone taper over next week Fluids and rest  Hydromet 1-2 tsp every 4-6 hr As needed  Cough, may make you sleepy Please contact office for sooner follow up if symptoms do not improve or worsen or seek emergency care  follow up Dr. Craige Cotta  In 4  weeks and As needed

## 2011-04-11 NOTE — Progress Notes (Signed)
  Subjective:    Patient ID: Elizabeth Newman, female    DOB: 1950/10/05, 60 y.o.   MRN: [id number]  HPI  60 yo female with hx of Asthma , active smoker  03/06/11 Acute OV  Pt presents for an acute office visit. Complains of prod cough with green/brown mucus, wheezing, increased SOB this am.  OTC not helping. No fever or chest pain. No edema. No recent travel or abx use.  Increased SABA use today .  We discussed smoking cesstation . She would like to try Chantix.  >>augmentin /steroid taper   04/10/2011 Acute OV  Pt complains of prod cough with yellow/gray/green mucus, increased SOB, wheezing x1week. She has quit smoking. Says cough is worse since quitting smoking. She was seen 1 month ago with bronchitic symptoms. Tx with augmentin and steroid taper.  She got better been over last week symptoms have returned. OTC not helping.   Review of Systems  Constitutional:   No  weight loss, night sweats,  Fevers, chills,  +fatigue, or  lassitude.  HEENT:   No headaches,  Difficulty swallowing,  Tooth/dental problems, or  Sore throat,                No sneezing, itching, ear ache, nasal congestion, post nasal drip,   CV:  No chest pain,  Orthopnea, PND, swelling in lower extremities, anasarca, dizziness, palpitations, syncope.   GI  No heartburn, indigestion, abdominal pain, nausea, vomiting, diarrhea, change in bowel habits, loss of appetite, bloody stools.   Resp:   No coughing up of blood.  Marland Kitchen  No chest wall deformity  Skin: no rash or lesions.  GU: no dysuria, change in color of urine, no urgency or frequency.  No flank pain, no hematuria   MS:  No joint pain or swelling.  No decreased range of motion.  No back pain.  Psych:  No change in mood or affect. No depression or anxiety.  No memory loss.          Objective:   Physical Exam  GEN: A/Ox3; pleasant , NAD  HEENT:  [place]/AT,  EACs-clear, TMs-wnl, NOSE-clear, THROAT-clear, no lesions, no postnasal drip or exudate noted.    NECK:  Supple w/ fair ROM; no JVD; normal carotid impulses w/o bruits; no thyromegaly or nodules palpated; no lymphadenopathy.  RESP  Coarse BS w/ faint exp wheeze no accessory muscle use, no dullness to percussion  CARD:  RRR, no m/r/g  , no peripheral edema, pulses intact, no cyanosis or clubbing.  GI:   Soft & nt; nml bowel sounds; no organomegaly or masses detected.  Musco: Warm bil, no deformities or joint swelling noted.   Neuro: alert, no focal deficits noted.    Skin: Warm, no lesions or rashes         Assessment & Plan:

## 2011-04-11 NOTE — Progress Notes (Signed)
Reviewed, and agree with plan.

## 2011-04-12 ENCOUNTER — Telehealth: Payer: Self-pay | Admitting: Adult Health

## 2011-04-12 NOTE — Telephone Encounter (Signed)
Called spoke with patient, advised of cxr results as stated by TP for 8.13.12 cxr.  Pt verbalized her understanding.

## 2011-05-26 ENCOUNTER — Other Ambulatory Visit: Payer: Self-pay | Admitting: Physician Assistant

## 2011-05-26 DIAGNOSIS — Z1231 Encounter for screening mammogram for malignant neoplasm of breast: Secondary | ICD-10-CM

## 2011-06-07 ENCOUNTER — Ambulatory Visit
Admission: RE | Admit: 2011-06-07 | Discharge: 2011-06-07 | Disposition: A | Discharge status: Home / Self Care | Payer: 59 | Source: Ambulatory Visit | Attending: Physician Assistant | Admitting: Physician Assistant

## 2011-06-07 DIAGNOSIS — Z1231 Encounter for screening mammogram for malignant neoplasm of breast: Secondary | ICD-10-CM

## 2011-07-13 ENCOUNTER — Telehealth: Payer: Self-pay | Admitting: Pulmonary Disease

## 2011-07-13 NOTE — Telephone Encounter (Signed)
Qvar was stopped at 04/10/2011 OV with TP. If pt is needing refills on this medication she will need to contact our office.

## 2011-08-01 ENCOUNTER — Telehealth: Payer: Self-pay | Admitting: Pulmonary Disease

## 2011-08-01 NOTE — Telephone Encounter (Signed)
Pt would like to know if she should go to UC & can also be reached on her cell, [phone].  Elizabeth Newman

## 2011-08-01 NOTE — Telephone Encounter (Signed)
I spoke with pt and she states she is having a lot of SOB and is worse today. Pt states she is going to go to UC and be evaluated now.Pt did not need anything further

## 2011-08-30 ENCOUNTER — Other Ambulatory Visit: Payer: Self-pay | Admitting: *Deleted

## 2011-08-30 MED ORDER — ALBUTEROL SULFATE HFA 108 (90 BASE) MCG/ACT IN AERS: 2.0000 | INHALATION_SPRAY | Freq: Four times a day (QID) | RESPIRATORY_TRACT | Status: DC | PRN

## 2011-09-25 ENCOUNTER — Emergency Department (INDEPENDENT_AMBULATORY_CARE_PROVIDER_SITE_OTHER)
Admission: EM | Admit: 2011-09-25 | Discharge: 2011-09-25 | Disposition: A | Discharge status: Home / Self Care | Payer: 59 | Source: Home / Self Care | Attending: Emergency Medicine | Admitting: Emergency Medicine

## 2011-09-25 ENCOUNTER — Encounter (HOSPITAL_COMMUNITY): Payer: Self-pay | Admitting: *Deleted

## 2011-09-25 DIAGNOSIS — S81009A Unspecified open wound, unspecified knee, initial encounter: Secondary | ICD-10-CM

## 2011-09-25 DIAGNOSIS — S81811A Laceration without foreign body, right lower leg, initial encounter: Secondary | ICD-10-CM

## 2011-09-25 MED ORDER — LIDOCAINE-EPINEPHRINE 2 %-1:100000 IJ SOLN: 5.0000 mL | Freq: Once | INTRAMUSCULAR | Status: DC

## 2011-09-25 MED ORDER — MELOXICAM 15 MG PO TABS: 15.0000 mg | ORAL_TABLET | Freq: Every day | ORAL | Status: AC

## 2011-09-25 MED ORDER — HYDROCODONE-ACETAMINOPHEN 5-325 MG PO TABS: 2.0000 | ORAL_TABLET | ORAL | Status: AC | PRN

## 2011-09-25 MED ORDER — BACITRACIN 500 UNIT/GM EX OINT: 1.0000 "application " | TOPICAL_OINTMENT | Freq: Once | CUTANEOUS | Status: DC

## 2011-09-25 NOTE — ED Provider Notes (Signed)
History     CSN: [id number]  Arrival date & time 09/25/11  4098   First MD Initiated Contact with Patient 09/25/11 1004      Chief Complaint  Patient presents with  . Laceration    (Consider location/radiation/quality/duration/timing/severity/associated sxs/prior treatment) HPI Comments: Patient states that she was going of some stairs, tripped over her dog, and landed on her right leg and both hands earlier this morning. Sustained large laceration to right lower extremity anteriorly. No numbness, weakness, foreign body sensation, fevers. Washed the wound with some water, and went to work. Was sent here from work for stitches.  ROS as noted in HPI. All other ROS negative.   Patient is a 61 y.o. female presenting with skin laceration. The history is provided by the patient. No language interpreter was used.  Laceration  The incident occurred 3 to 5 hours ago. The laceration is located on the right leg. The laceration is 5 cm in size. The pain has been constant since onset. She reports no foreign bodies present. Her tetanus status is UTD.    Past Medical History  Diagnosis Date  . Hypothyroidism   . HTN (hypertension)   . Hyperlipidemia   . Chronic obstructive asthma   . Allergic rhinitis     Past Surgical History  Procedure Date  . Back surgery 1993 and 2000    Family History  Problem Relation Age of Onset  . Asthma Mother   . Asthma Paternal Grandfather   . Emphysema Mother   . Hypertension Father   . Bone cancer Maternal Grandfather   . Heart disease Maternal Grandmother   . Heart disease Paternal Grandmother   . Allergies Mother     History  Substance Use Topics  . Smoking status: Former Smoker -- 0.3 packs/day for 30 years    Types: Cigarettes    Quit date: 04/05/2011  . Smokeless tobacco: Not on file  . Alcohol Use: No    OB History    Grav Para Term Preterm Abortions TAB SAB Ect Mult Living                  Review of Systems  Allergies   Iodine  Home Medications   Current Outpatient Rx  Name Route Sig Dispense Refill  . METOPROLOL TARTRATE 50 MG PO TABS Oral Take 50 mg by mouth 2 (two) times daily.    . ALBUTEROL SULFATE HFA 108 (90 BASE) MCG/ACT IN AERS Inhalation Inhale 2 puffs into the lungs every 6 (six) hours as needed for wheezing. PATIENT NEEDS TO CALL FOR AN APPOINTMENT 1 Inhaler 0  . HYDROCODONE-ACETAMINOPHEN 5-325 MG PO TABS Oral Take 2 tablets by mouth every 4 (four) hours as needed for pain. 20 tablet 0  . LEVOTHYROXINE SODIUM 112 MCG PO TABS Oral Take 112 mcg by mouth daily.      . MELOXICAM 15 MG PO TABS Oral Take 1 tablet (15 mg total) by mouth daily. 14 tablet 0  . MOMETASONE FURO-FORMOTEROL FUM 200-5 MCG/ACT IN AERO Inhalation Inhale 2 puffs into the lungs 2 (two) times daily.    Marland Kitchen SIMVASTATIN 40 MG PO TABS Oral Take 40 mg by mouth at bedtime.      Marland Kitchen SALINE NASAL SPRAY 0.65 % NA SOLN Nasal Place 1 spray into the nose as needed.        BP 226/108  Pulse 90  Temp(Src) 98.2 F (36.8 C) (Oral)  Resp 17  SpO2 96%  Physical Exam  Nursing note and  vitals reviewed. Constitutional: She is oriented to person, place, and time. She appears well-developed and well-nourished. No distress.  HENT:  Head: Normocephalic and atraumatic.  Eyes: Conjunctivae and EOM are normal.  Neck: Normal range of motion.  Cardiovascular: Regular rhythm.   Pulmonary/Chest: Effort normal.  Abdominal: She exhibits no distension.  Musculoskeletal: Normal range of motion.       Legs:      4.5 cm deep laceration extending to the bone. Periosteum intact. Patient able to dorsiflex/plantar flexed foot. DP 2+. Sensation grossly intact. No bony crepitus, signs of fracture.  Neurological: She is alert and oriented to person, place, and time.  Skin: Skin is warm and dry.  Psychiatric: She has a normal mood and affect. Her behavior is normal. Judgment and thought content normal.    ED Course  LACERATION REPAIR Date/Time: 09/25/2011 1:21  PM Performed by: Luiz Blare Authorized by: Luiz Blare Consent: Verbal consent obtained. Written consent not obtained. Risks and benefits: risks, benefits and alternatives were discussed Consent given by: patient Patient understanding: patient states understanding of the procedure being performed Patient consent: the patient's understanding of the procedure matches consent given Procedure consent: procedure consent matches procedure scheduled Required items: required blood products, implants, devices, and special equipment available Patient identity confirmed: verbally with patient and arm band Time out: Immediately prior to procedure a "time out" was called to verify the correct patient, procedure, equipment, support staff and site/side marked as required. Body area: lower extremity Location details: right lower leg Laceration length: 4.5 cm Foreign bodies: no foreign bodies Tendon involvement: none Nerve involvement: none Vascular damage: no Anesthesia: local infiltration Local anesthetic: lidocaine 2% with epinephrine Anesthetic total: 10 ml Patient sedated: no Preparation: Patient was prepped and draped in the usual sterile fashion. Irrigation solution: saline Amount of cleaning: extensive Degree of undermining: extensive Skin closure: 3-0 nylon and Ethilon Subcutaneous closure: 3-0 Chromic gut Fascia closure: 3-0 Vicryl Number of sutures: 16 Technique: horizontal mattress and simple Approximation: close Approximation difficulty: complex Dressing: 4x4 sterile gauze, antibiotic ointment and pressure dressing Patient tolerance: Patient tolerated the procedure well with no immediate complications. Comments: scrubbed area with chlorhexidine and tap water, then irrigated with chlorhexidine /tap water solution. Then irrigated wound with 1 L sterile saline. wound explored through full range of motion with adequate hemostasis.    (including critical care  time)  Labs Reviewed - No data to display No results found.   1. Laceration of right lower leg       MDM  Patient to ice, elevate foot. Patient to use crutches for walking around for the next 10 days. Sent home with anti-inflammatories, Norco. Patient to followup here or with her primary care physician in 10 days for suture removal. Discussed when to return the ED.  Luiz Blare, MD 09/25/11 1331

## 2011-09-25 NOTE — ED Notes (Signed)
Pt  Sustained  A   lacertion  To  Her  r  Lower  Leg  In  Shin  Area   When  She  Felled this  Am     Bleeding  Has  Subsided     No  Obvious  Deformity  Noted

## 2011-10-05 ENCOUNTER — Encounter (HOSPITAL_COMMUNITY): Payer: Self-pay | Admitting: Emergency Medicine

## 2011-10-05 ENCOUNTER — Emergency Department (INDEPENDENT_AMBULATORY_CARE_PROVIDER_SITE_OTHER)
Admission: EM | Admit: 2011-10-05 | Discharge: 2011-10-05 | Disposition: A | Discharge status: Home / Self Care | Payer: 59 | Source: Home / Self Care | Attending: Family Medicine | Admitting: Family Medicine

## 2011-10-05 DIAGNOSIS — Z4802 Encounter for removal of sutures: Secondary | ICD-10-CM

## 2011-10-05 NOTE — ED Notes (Signed)
Recheck wound to right lower leg.  Incident 10 days ago.

## 2011-10-05 NOTE — ED Provider Notes (Signed)
History     CSN: [id number]  Arrival date & time 10/05/11  1642   First MD Initiated Contact with Patient 10/05/11 1801      Chief Complaint  Patient presents with  . Suture / Staple Removal    (Consider location/radiation/quality/duration/timing/severity/associated sxs/prior treatment) Patient is a 61 y.o. female presenting with suture removal. The history is provided by the patient.  Suture / Staple Removal  The sutures were placed 7 to 10 days ago. Treatments since wound repair include antibiotic ointment use. There is no redness present. There is no swelling present. The pain has no pain. She has no difficulty moving the affected extremity or digit.    Past Medical History  Diagnosis Date  . Hypothyroidism   . HTN (hypertension)   . Hyperlipidemia   . Chronic obstructive asthma   . Allergic rhinitis     Past Surgical History  Procedure Date  . Back surgery 1993 and 2000    Family History  Problem Relation Age of Onset  . Asthma Mother   . Asthma Paternal Grandfather   . Emphysema Mother   . Hypertension Father   . Bone cancer Maternal Grandfather   . Heart disease Maternal Grandmother   . Heart disease Paternal Grandmother   . Allergies Mother     History  Substance Use Topics  . Smoking status: Former Smoker -- 0.3 packs/day for 30 years    Types: Cigarettes    Quit date: 04/05/2011  . Smokeless tobacco: Not on file  . Alcohol Use: No    OB History    Grav Para Term Preterm Abortions TAB SAB Ect Mult Living                  Review of Systems  Constitutional: Negative.   Skin: Positive for wound.    Allergies  Iodine  Home Medications   Current Outpatient Rx  Name Route Sig Dispense Refill  . ALBUTEROL SULFATE HFA 108 (90 BASE) MCG/ACT IN AERS Inhalation Inhale 2 puffs into the lungs every 6 (six) hours as needed for wheezing. PATIENT NEEDS TO CALL FOR AN APPOINTMENT 1 Inhaler 0  . HYDROCODONE-ACETAMINOPHEN 5-325 MG PO TABS Oral Take 2  tablets by mouth every 4 (four) hours as needed for pain. 20 tablet 0  . LEVOTHYROXINE SODIUM 112 MCG PO TABS Oral Take 112 mcg by mouth daily.      . MELOXICAM 15 MG PO TABS Oral Take 1 tablet (15 mg total) by mouth daily. 14 tablet 0  . METOPROLOL TARTRATE 50 MG PO TABS Oral Take 50 mg by mouth 2 (two) times daily.    . MOMETASONE FURO-FORMOTEROL FUM 200-5 MCG/ACT IN AERO Inhalation Inhale 2 puffs into the lungs 2 (two) times daily.    Marland Kitchen SIMVASTATIN 40 MG PO TABS Oral Take 40 mg by mouth at bedtime.      Marland Kitchen SALINE NASAL SPRAY 0.65 % NA SOLN Nasal Place 1 spray into the nose as needed.        BP 218/101  Pulse 95  Temp(Src) 98.6 F (37 C) (Oral)  Resp 18  SpO2 97%  Physical Exam  Nursing note and vitals reviewed. Constitutional: She appears well-developed and well-nourished.  Skin: Skin is warm and dry.       ED Course  Procedures (including critical care time)  Labs Reviewed - No data to display No results found.   1. Visit for suture removal       MDM  Suture removal.  Barkley Bruns, MD 10/06/11 1044

## 2011-10-23 ENCOUNTER — Encounter: Payer: Self-pay | Admitting: Adult Health

## 2011-10-23 ENCOUNTER — Ambulatory Visit (INDEPENDENT_AMBULATORY_CARE_PROVIDER_SITE_OTHER): Payer: 59 | Admitting: Adult Health

## 2011-10-23 VITALS — BP 140/80 | HR 95 | Temp 97.5°F | Ht 65.0 in | Wt 155.4 lb

## 2011-10-23 DIAGNOSIS — J45909 Unspecified asthma, uncomplicated: Secondary | ICD-10-CM

## 2011-10-23 MED ORDER — CEFDINIR 300 MG PO CAPS: 300.0000 mg | ORAL_CAPSULE | Freq: Two times a day (BID) | ORAL | Status: AC

## 2011-10-23 MED ORDER — MOMETASONE FURO-FORMOTEROL FUM 200-5 MCG/ACT IN AERO: 2.0000 | INHALATION_SPRAY | Freq: Two times a day (BID) | RESPIRATORY_TRACT | Status: DC

## 2011-10-23 MED ORDER — HYDROCODONE-HOMATROPINE 5-1.5 MG/5ML PO SYRP: 5.0000 mL | ORAL_SOLUTION | Freq: Four times a day (QID) | ORAL | Status: AC | PRN

## 2011-10-23 NOTE — Progress Notes (Signed)
  Subjective:    Patient ID: Elizabeth Newman, female    DOB: Apr 05, 1951, 61 y.o.   MRN: [id number]  HPI  61 yo female with hx of Asthma , active smoker  03/06/11 Acute OV  Pt presents for an acute office visit. Complains of prod cough with green/brown mucus, wheezing, increased SOB this am.  OTC not helping. No fever or chest pain. No edema. No recent travel or abx use.  Increased SABA use today .  We discussed smoking cesstation . She would like to try Chantix.  >>augmentin /steroid taper   04/10/2011 Acute OV  Pt complains of prod cough with yellow/gray/green mucus, increased SOB, wheezing x1week. She has quit smoking. Says cough is worse since quitting smoking. She was seen 1 month ago with bronchitic symptoms. Tx with augmentin and steroid taper.  She got better been over last week symptoms have returned. OTC not helping.  >>Dulera rx. QVAR stopped , doxycycline and steroid taper   10/23/2011 Acute OV  Complains of prod cough with brown mucus, burning and tightness in chest, increased SOB x3days. Was started on Dulera last ov, however only took sample. Did not get rx at pharmacy,-says no rx at pharmacy.  Has quit smoking. No hemopysis or edema. Coughing up thick mucus. No significant wheezing . No fever.  Last cxr 03/2012 w/ no acute process   Review of Systems  Constitutional:   No  weight loss, night sweats,  Fevers, chills,  +fatigue, or  lassitude.  HEENT:   No headaches,  Difficulty swallowing,  Tooth/dental problems, or  Sore throat,                No sneezing, itching, ear ache, nasal congestion, post nasal drip,   CV:  No chest pain,  Orthopnea, PND, swelling in lower extremities, anasarca, dizziness, palpitations, syncope.   GI  No heartburn, indigestion, abdominal pain, nausea, vomiting, diarrhea, change in bowel habits, loss of appetite, bloody stools.   Resp:   No coughing up of blood.  Marland Kitchen  No chest wall deformity  Skin: no rash or lesions.  GU: no dysuria, change in  color of urine, no urgency or frequency.  No flank pain, no hematuria   MS:  No joint pain or swelling.  No decreased range of motion.  No back pain.  Psych:  No change in mood or affect. No depression or anxiety.  No memory loss.          Objective:   Physical Exam  GEN: A/Ox3; pleasant , NAD  HEENT:  [place]/AT,  EACs-clear, TMs-wnl, NOSE-clear, THROAT-clear, no lesions, no postnasal drip or exudate noted.   NECK:  Supple w/ fair ROM; no JVD; normal carotid impulses w/o bruits; no thyromegaly or nodules palpated; no lymphadenopathy.  RESP  Coarse BS w/ faint exp wheeze no accessory muscle use, no dullness to percussion  CARD:  RRR, no m/r/g  , no peripheral edema, pulses intact, no cyanosis or clubbing.  GI:   Soft & nt; nml bowel sounds; no organomegaly or masses detected.  Musco: Warm bil, no deformities or joint swelling noted.   Neuro: alert, no focal deficits noted.    Skin: Warm, no lesions or rashes         Assessment & Plan:

## 2011-10-23 NOTE — Assessment & Plan Note (Signed)
Flare with URI   Plan:  Restart  Dulera 200 2 puffs Twice daily  , brush/rinse and gargle after use. Omnicef 300 mg  Twice daily  For 7 days -take with food Mucinex DM Twice daily  For cough/congestion  Fluids and rest  Hydromet 1-2 tsp every 4-6 hr As needed  Cough, may make you sleepy We are very proud of you for quitting smoking.  Please contact office for sooner follow up if symptoms do not improve or worsen or seek emergency care  follow up Dr. Craige Cotta  In 4  weeks and As needed

## 2011-10-23 NOTE — Patient Instructions (Signed)
Restart  Dulera 200 2 puffs Twice daily  , brush/rinse and gargle after use. Omnicef 300 mg  Twice daily  For 7 days -take with food Mucinex DM Twice daily  For cough/congestion  Fluids and rest  Hydromet 1-2 tsp every 4-6 hr As needed  Cough, may make you sleepy We are very proud of you for quitting smoking.  Please contact office for sooner follow up if symptoms do not improve or worsen or seek emergency care  follow up Dr. Craige Cotta  In 4  weeks and As needed

## 2011-10-24 NOTE — Progress Notes (Signed)
Reviewed and agree with assessment/plan. 

## 2011-10-26 ENCOUNTER — Telehealth: Payer: Self-pay | Admitting: Adult Health

## 2011-10-26 NOTE — Telephone Encounter (Signed)
Per [employer], Elizabeth Newman is covered under pt's plan. Rejection pharmacy is getting is for the Pharmacy Help Desk and the pharmacy will need to contact the help desk for assistance. There is nothing needed from our office. Pharmacy notified.

## 2011-11-21 ENCOUNTER — Encounter: Payer: Self-pay | Admitting: Pulmonary Disease

## 2011-11-21 ENCOUNTER — Ambulatory Visit (INDEPENDENT_AMBULATORY_CARE_PROVIDER_SITE_OTHER): Payer: 59 | Admitting: Pulmonary Disease

## 2011-11-21 VITALS — BP 164/70 | HR 81 | Temp 98.1°F | Ht 66.0 in | Wt 155.2 lb

## 2011-11-21 DIAGNOSIS — J45909 Unspecified asthma, uncomplicated: Secondary | ICD-10-CM

## 2011-11-21 DIAGNOSIS — J309 Allergic rhinitis, unspecified: Secondary | ICD-10-CM

## 2011-11-21 MED ORDER — MOMETASONE FURO-FORMOTEROL FUM 100-5 MCG/ACT IN AERO: 2.0000 | INHALATION_SPRAY | Freq: Two times a day (BID) | RESPIRATORY_TRACT | Status: DC

## 2011-11-21 MED ORDER — FLUTICASONE PROPIONATE 50 MCG/ACT NA SUSP: 2.0000 | Freq: Every day | NASAL | Status: DC

## 2011-11-21 NOTE — Progress Notes (Signed)
Chief Complaint  Patient presents with  . Follow-up    Pt states her breathing has been fair. pt c/o nasal congestion, sore throat, PND, blowing out green w/ stringy blood in it, some facial pressure x 3 weeks.    History of Present Illness: Elizabeth Newman is a 61 y.o. female former smoker with chronic obstructive asthma, and rhinitis.  PFT 04/27/10 FEV1 2.17(90%), FEV1% 68, TLC 5.48(107%), DLCO 81%, +BD response.  She was treated for exacerbation in February 2013.  She has been doing better with her breathing since she started dulera.  She stopped smoking 12 weeks ago.  She is not needing to use her ventolin.  She still has sinus congestion, with clear to yellow/green drainage.  She denies fever or gland swelling.   Past Medical History  Diagnosis Date  . Hypothyroidism   . HTN (hypertension)   . Hyperlipidemia   . Chronic obstructive asthma   . Allergic rhinitis     Past Surgical History  Procedure Date  . Back surgery 1993 and 2000    Allergies  Allergen Reactions  . Iodine     Physical Exam:  Blood pressure 164/70, pulse 81, temperature 98.1 F (36.7 C), temperature source Oral, height 5\' 6"  (1.676 m), weight 155 lb 3.2 oz (70.398 kg), SpO2 96.00%. Body mass index is 25.05 kg/(m^2). Wt Readings from Last 2 Encounters:  11/21/11 155 lb 3.2 oz (70.398 kg)  10/23/11 155 lb 6.4 oz (70.489 kg)    General - no distress HEENT - clear nasal discharge, no sinus tenderness, TM clear, no oral exudate, no LAN Cardiac - s1s2 regular, no murmur Chest - no wheeze, rales, dullness Abdomen - soft, nontender Extremities - no edema Skin - no rashes Neurologic - normal strength Psychiatric - normal mood, behavior   Assessment/Plan:  Outpatient Encounter Prescriptions as of 11/21/2011  Medication Sig Dispense Refill  . albuterol (VENTOLIN HFA) 108 (90 BASE) MCG/ACT inhaler Inhale 2 puffs into the lungs every 6 (six) hours as needed for wheezing. PATIENT NEEDS TO CALL FOR AN  APPOINTMENT  1 Inhaler  0  . ALPRAZolam (XANAX) 0.5 MG tablet As needed      . levothyroxine (SYNTHROID, LEVOTHROID) 112 MCG tablet Take 112 mcg by mouth daily.        . meloxicam (MOBIC) 15 MG tablet Take 1 tablet (15 mg total) by mouth daily.  14 tablet  0  . metoprolol (LOPRESSOR) 50 MG tablet Take 50 mg by mouth 2 (two) times daily.      . Mometasone Furo-Formoterol Fum 200-5 MCG/ACT AERO Inhale 2 puffs into the lungs 2 (two) times daily.  1 Inhaler  11  . simvastatin (ZOCOR) 40 MG tablet Take 40 mg by mouth at bedtime.        . sodium chloride (OCEAN) 0.65 % nasal spray Place 1 spray into the nose as needed.          Jandi Swiger Pager:  [phone] 11/21/2011, 10:12 AM

## 2011-11-21 NOTE — Assessment & Plan Note (Signed)
She is to continue nasal irrigation.  Will add flonase.  She is to use this two sprays daily for two to three weeks, then as needed.

## 2011-11-21 NOTE — Patient Instructions (Signed)
Finish current inhaler of dulera.  Once this is finished switch to lower dose of dulera, but continue to use two puffs twice per day and rinse mouth after using Ventolin two puffs as needed for cough, wheeze, or chest congestion Flonase two sprays daily for 2 to 3 weeks, and then as needed Continue saline spray for nose Follow up in 6 months

## 2011-11-21 NOTE — Assessment & Plan Note (Addendum)
She has done better after she stopped smoking, and started dulera.  Will have her finish her current inhaler of 200 strength, and then have her decrease to 100 strength for dulera.  She is to continue ventolin prn.  She will need follow up spirometry at next visit.

## 2012-06-19 ENCOUNTER — Other Ambulatory Visit: Payer: Self-pay | Admitting: Family Medicine

## 2012-06-19 DIAGNOSIS — Z1231 Encounter for screening mammogram for malignant neoplasm of breast: Secondary | ICD-10-CM

## 2012-06-25 ENCOUNTER — Ambulatory Visit
Admission: RE | Admit: 2012-06-25 | Discharge: 2012-06-25 | Disposition: A | Discharge status: Home / Self Care | Payer: 59 | Source: Ambulatory Visit | Attending: Family Medicine | Admitting: Family Medicine

## 2012-06-25 DIAGNOSIS — Z1231 Encounter for screening mammogram for malignant neoplasm of breast: Secondary | ICD-10-CM

## 2012-08-23 ENCOUNTER — Other Ambulatory Visit: Payer: Self-pay | Admitting: Physician Assistant

## 2012-08-30 ENCOUNTER — Other Ambulatory Visit: Payer: 59

## 2012-09-06 ENCOUNTER — Other Ambulatory Visit: Payer: 59

## 2012-09-16 ENCOUNTER — Ambulatory Visit
Admission: RE | Admit: 2012-09-16 | Discharge: 2012-09-16 | Disposition: A | Discharge status: Home / Self Care | Payer: 59 | Source: Ambulatory Visit | Attending: Physician Assistant | Admitting: Physician Assistant

## 2012-10-02 ENCOUNTER — Other Ambulatory Visit (HOSPITAL_COMMUNITY)
Admission: RE | Admit: 2012-10-02 | Discharge: 2012-10-02 | Disposition: A | Discharge status: Home / Self Care | Payer: 59 | Source: Ambulatory Visit | Attending: Family Medicine | Admitting: Family Medicine

## 2012-10-02 ENCOUNTER — Other Ambulatory Visit: Payer: Self-pay | Admitting: Physician Assistant

## 2012-10-02 DIAGNOSIS — Z124 Encounter for screening for malignant neoplasm of cervix: Secondary | ICD-10-CM | POA: Insufficient documentation

## 2012-12-16 ENCOUNTER — Encounter: Payer: Self-pay | Admitting: Pulmonary Disease

## 2012-12-16 ENCOUNTER — Ambulatory Visit (INDEPENDENT_AMBULATORY_CARE_PROVIDER_SITE_OTHER): Payer: 59 | Admitting: Pulmonary Disease

## 2012-12-16 VITALS — BP 148/80 | HR 92 | Temp 97.5°F | Ht 65.5 in | Wt 142.0 lb

## 2012-12-16 DIAGNOSIS — J449 Chronic obstructive pulmonary disease, unspecified: Secondary | ICD-10-CM

## 2012-12-16 DIAGNOSIS — J309 Allergic rhinitis, unspecified: Secondary | ICD-10-CM

## 2012-12-16 MED ORDER — ALBUTEROL SULFATE HFA 108 (90 BASE) MCG/ACT IN AERS: 2.0000 | INHALATION_SPRAY | Freq: Four times a day (QID) | RESPIRATORY_TRACT | Status: DC | PRN

## 2012-12-16 MED ORDER — MOMETASONE FURO-FORMOTEROL FUM 100-5 MCG/ACT IN AERO: 2.0000 | INHALATION_SPRAY | Freq: Two times a day (BID) | RESPIRATORY_TRACT | Status: DC

## 2012-12-16 MED ORDER — FLUTICASONE PROPIONATE 50 MCG/ACT NA SUSP: 2.0000 | Freq: Every day | NASAL | Status: DC

## 2012-12-16 NOTE — Assessment & Plan Note (Signed)
She is to resume flonase.  Explained that she could try using nasacort OTC if this is less expensive for her.

## 2012-12-16 NOTE — Progress Notes (Signed)
Chief Complaint  Patient presents with  . Acute Visit    C/o increase SOB w/ activity at w/ sleep. She stated last night she was gasping for air. C/o cough w/ white w/ yellow tint, chest tx. She went from 169 puffs in 2 days to 82 puffs left on her rescue inhaler.   . Medication Refill    dulera    History of Present Illness: Elizabeth Newman is a 62 y.o. female former smoker with chronic obstructive asthma, and rhinitis.   She ran out of dulera about 1.5 weeks ago.  She has not been using flonase.  She has noticed more trouble with her breathing for the past several days.  She has been getting more cough with white to yellow sputum.  She is wheezing some also.  She has more trouble at night.  She had trouble with sinus congestion when this first started.  She has been using her albuterol lots >> this helps, but doesn't last.  She denies fever or new skin rash.   TESTS: PFT 04/27/10 >> FEV1 2.17(90%), FEV1% 68, TLC 5.48(107%), DLCO 81%, +BD response.   Elizabeth Newman  has a past medical history of Hypothyroidism; HTN (hypertension); Hyperlipidemia; Chronic obstructive asthma; and Allergic rhinitis.  Elizabeth Newman  has past surgical history that includes Back surgery (1993 and 2000).  Prior to Admission medications   Medication Sig Start Date End Date Taking? Authorizing Provider  albuterol (VENTOLIN HFA) 108 (90 BASE) MCG/ACT inhaler Inhale 2 puffs into the lungs every 6 (six) hours as needed for wheezing. PATIENT NEEDS TO CALL FOR AN APPOINTMENT 08/30/11 12/16/12 Yes Tammy [employer], NP  ALPRAZolam Prudy Feeler) 0.5 MG tablet As needed 10/04/11  Yes Historical Provider, MD  levothyroxine (SYNTHROID, LEVOTHROID) 112 MCG tablet Take 112 mcg by mouth daily.     Yes Historical Provider, MD  metoprolol (LOPRESSOR) 50 MG tablet Take 50 mg by mouth daily.    Yes Historical Provider, MD  simvastatin (ZOCOR) 40 MG tablet Take 40 mg by mouth at bedtime.     Yes Historical Provider, MD  sodium chloride  (OCEAN) 0.65 % nasal spray Place 1 spray into the nose as needed.     Yes Historical Provider, MD  mometasone-formoterol (DULERA) 100-5 MCG/ACT AERO Inhale 2 puffs into the lungs 2 (two) times daily. 11/21/11   Coralyn Helling, MD    Allergies  Allergen Reactions  . Iodine      Physical Exam:  General - No distress ENT - No sinus tenderness, no oral exudate, no LAN Cardiac - s1s2 regular, no murmur Chest - decreased breath sounds, faint wheeze, no rales Back - No focal tenderness Abd - Soft, non-tender Ext - No edema Neuro - Normal strength Skin - No rashes Psych - normal mood, and behavior   Assessment/Plan:  Coralyn Helling, MD [place] [facility] Pager:  [phone]

## 2012-12-16 NOTE — Patient Instructions (Signed)
Dulera two puffs twice per day Albuterol two puffs as needed for cough, wheeze, or chest congestion Flonase two sprays each nostril as needed >> can also try using nasacort in place of flonase which you can get without a prescription Call if no better  Follow up in one year

## 2012-12-16 NOTE — Assessment & Plan Note (Addendum)
She has an exacerbation likely related to running out of her medications.  I have sent refills for these and given her sample of dulera.  She is to call if she is not feeling better.  I don't think she needs prednisone, antibiotics, or xray at this time.

## 2013-05-19 ENCOUNTER — Observation Stay (HOSPITAL_COMMUNITY)
Admission: EM | Admit: 2013-05-19 | Discharge: 2013-05-21 | Disposition: A | Discharge status: Home / Self Care | Payer: 59 | Attending: General Surgery | Admitting: General Surgery

## 2013-05-19 ENCOUNTER — Emergency Department (HOSPITAL_COMMUNITY): Payer: 59

## 2013-05-19 ENCOUNTER — Encounter (HOSPITAL_COMMUNITY): Payer: Self-pay | Admitting: Emergency Medicine

## 2013-05-19 DIAGNOSIS — J449 Chronic obstructive pulmonary disease, unspecified: Secondary | ICD-10-CM | POA: Insufficient documentation

## 2013-05-19 DIAGNOSIS — K8000 Calculus of gallbladder with acute cholecystitis without obstruction: Secondary | ICD-10-CM | POA: Insufficient documentation

## 2013-05-19 DIAGNOSIS — E785 Hyperlipidemia, unspecified: Secondary | ICD-10-CM | POA: Insufficient documentation

## 2013-05-19 DIAGNOSIS — F410 Panic disorder [episodic paroxysmal anxiety] without agoraphobia: Secondary | ICD-10-CM | POA: Insufficient documentation

## 2013-05-19 DIAGNOSIS — I10 Essential (primary) hypertension: Secondary | ICD-10-CM | POA: Insufficient documentation

## 2013-05-19 DIAGNOSIS — J4489 Other specified chronic obstructive pulmonary disease: Secondary | ICD-10-CM | POA: Insufficient documentation

## 2013-05-19 DIAGNOSIS — E039 Hypothyroidism, unspecified: Secondary | ICD-10-CM | POA: Insufficient documentation

## 2013-05-19 DIAGNOSIS — K8010 Calculus of gallbladder with chronic cholecystitis without obstruction: Principal | ICD-10-CM | POA: Insufficient documentation

## 2013-05-19 DIAGNOSIS — F17200 Nicotine dependence, unspecified, uncomplicated: Secondary | ICD-10-CM | POA: Insufficient documentation

## 2013-05-19 DIAGNOSIS — Z79899 Other long term (current) drug therapy: Secondary | ICD-10-CM | POA: Insufficient documentation

## 2013-05-19 DIAGNOSIS — K810 Acute cholecystitis: Secondary | ICD-10-CM | POA: Diagnosis present

## 2013-05-19 LAB — COMPREHENSIVE METABOLIC PANEL
ALT: 42 U/L — ABNORMAL HIGH (ref 0–35)
AST: 43 U/L — ABNORMAL HIGH (ref 0–37)
BUN: 9 mg/dL (ref 6–23)
Calcium: 9.7 mg/dL (ref 8.4–10.5)
GFR calc Af Amer: >90 mL/min (ref >90)
Potassium: 4.1 mEq/L (ref 3.5–5.1)
Sodium: 134 mEq/L — ABNORMAL LOW (ref 135–145)
Total Bilirubin: 0.3 mg/dL (ref 0.3–1.2)
Total Protein: 7.6 g/dL (ref 6.0–8.3)

## 2013-05-19 LAB — CBC
HCT: 46.3 % — ABNORMAL HIGH (ref 36.0–46.0)
Hemoglobin: 15.7 g/dL — ABNORMAL HIGH (ref 12.0–15.0)
MCHC: 33.9 g/dL (ref 30.0–36.0)
WBC: 12.8 10*3/uL — ABNORMAL HIGH (ref 4.0–10.5)

## 2013-05-19 LAB — POCT I-STAT TROPONIN I: Troponin i, poc: 0 ng/mL (ref 0.00–0.08)

## 2013-05-19 MED ORDER — FAMOTIDINE IN NACL 20-0.9 MG/50ML-% IV SOLN
20.0000 mg | Freq: Once | INTRAVENOUS | Status: AC
  Administered 2013-05-19: 20 mg via INTRAVENOUS
  Filled 2013-05-19: qty 50

## 2013-05-19 MED ORDER — ONDANSETRON HCL 4 MG/2ML IJ SOLN
4.0000 mg | Freq: Once | INTRAMUSCULAR | Status: AC
  Administered 2013-05-19: 4 mg via INTRAVENOUS
  Filled 2013-05-19: qty 2

## 2013-05-19 MED ORDER — MORPHINE SULFATE 4 MG/ML IJ SOLN
4.0000 mg | Freq: Once | INTRAMUSCULAR | Status: AC
  Administered 2013-05-19: 4 mg via INTRAVENOUS
  Filled 2013-05-19: qty 1

## 2013-05-19 MED ORDER — NITROGLYCERIN 0.4 MG SL SUBL
SUBLINGUAL_TABLET | SUBLINGUAL | Status: AC
  Filled 2013-05-19: qty 25

## 2013-05-19 MED ORDER — ASPIRIN 81 MG PO CHEW
162.0000 mg | CHEWABLE_TABLET | Freq: Once | ORAL | Status: AC
  Administered 2013-05-19: 162 mg via ORAL
  Filled 2013-05-19: qty 2

## 2013-05-19 MED ORDER — NITROGLYCERIN 0.4 MG SL SUBL
0.4000 mg | SUBLINGUAL_TABLET | SUBLINGUAL | Status: DC | PRN
  Administered 2013-05-19: 0.4 mg via SUBLINGUAL

## 2013-05-19 NOTE — ED Notes (Addendum)
administered NTG

## 2013-05-19 NOTE — ED Notes (Addendum)
Pt reports 8/10 centralized chest pain with radiation to the right shoulder that began at 1600. Pt reports shortness of breath, nausea, dizziness, emesis, and back pain. Pt reports that she thought the pain was gastric reflux and took antiacid medication, without relief.  Pt reports 4 cigarettes per day, elevated cholesterol, and hypertension. Pt BP on arrival is 234/100, pt reports taking her blood pressure medication today. Pt also c/o 10/10 centralized mid-abdominal pain. Pt is A/O x4.

## 2013-05-20 ENCOUNTER — Encounter (HOSPITAL_COMMUNITY)
Admission: EM | Disposition: A | Discharge status: Home / Self Care | Payer: Self-pay | Source: Home / Self Care | Attending: Emergency Medicine

## 2013-05-20 ENCOUNTER — Observation Stay (HOSPITAL_COMMUNITY): Payer: 59

## 2013-05-20 ENCOUNTER — Encounter (HOSPITAL_COMMUNITY): Payer: Self-pay | Admitting: Registered Nurse

## 2013-05-20 ENCOUNTER — Observation Stay (HOSPITAL_COMMUNITY): Payer: 59 | Admitting: Registered Nurse

## 2013-05-20 ENCOUNTER — Encounter (HOSPITAL_COMMUNITY): Payer: Self-pay | Admitting: Orthopedic Surgery

## 2013-05-20 DIAGNOSIS — K8010 Calculus of gallbladder with chronic cholecystitis without obstruction: Secondary | ICD-10-CM

## 2013-05-20 HISTORY — PX: CHOLECYSTECTOMY: SHX55

## 2013-05-20 LAB — CREATININE, SERUM
Creatinine, Ser: 0.53 mg/dL (ref 0.50–1.10)
GFR calc non Af Amer: >90 mL/min (ref >90)

## 2013-05-20 LAB — TROPONIN I: Troponin I: <0.3 ng/mL (ref <0.30)

## 2013-05-20 LAB — CBC
Hemoglobin: 14.6 g/dL (ref 12.0–15.0)
MCH: 32 pg (ref 26.0–34.0)
MCHC: 33.3 g/dL (ref 30.0–36.0)
Platelets: 218 10*3/uL (ref 150–400)
RDW: 13.4 % (ref 11.5–15.5)
WBC: 11.4 10*3/uL — ABNORMAL HIGH (ref 4.0–10.5)

## 2013-05-20 SURGERY — LAPAROSCOPIC CHOLECYSTECTOMY WITH INTRAOPERATIVE CHOLANGIOGRAM
Anesthesia: General | Site: Abdomen | Wound class: Clean Contaminated

## 2013-05-20 MED ORDER — FENTANYL CITRATE 0.05 MG/ML IJ SOLN
25.0000 ug | INTRAMUSCULAR | Status: DC | PRN
  Administered 2013-05-20 (×2): 50 ug via INTRAVENOUS

## 2013-05-20 MED ORDER — HYDROMORPHONE HCL PF 1 MG/ML IJ SOLN
0.5000 mg | INTRAMUSCULAR | Status: DC | PRN
  Administered 2013-05-20 – 2013-05-21 (×3): 1 mg via INTRAVENOUS
  Filled 2013-05-20 (×3): qty 1

## 2013-05-20 MED ORDER — HEPARIN SODIUM (PORCINE) 5000 UNIT/ML IJ SOLN
5000.0000 [IU] | Freq: Three times a day (TID) | INTRAMUSCULAR | Status: DC
  Administered 2013-05-20 – 2013-05-21 (×2): 5000 [IU] via SUBCUTANEOUS
  Filled 2013-05-20 (×5): qty 1

## 2013-05-20 MED ORDER — PHENYLEPHRINE HCL 10 MG/ML IJ SOLN
INTRAMUSCULAR | Status: DC | PRN
  Administered 2013-05-20 (×2): 80 ug via INTRAVENOUS

## 2013-05-20 MED ORDER — PANTOPRAZOLE SODIUM 40 MG IV SOLR
40.0000 mg | Freq: Every day | INTRAVENOUS | Status: DC
  Administered 2013-05-20: 40 mg via INTRAVENOUS
  Filled 2013-05-20 (×2): qty 40

## 2013-05-20 MED ORDER — CEFAZOLIN SODIUM-DEXTROSE 2-3 GM-% IV SOLR
INTRAVENOUS | Status: AC
  Filled 2013-05-20: qty 50

## 2013-05-20 MED ORDER — MEPERIDINE HCL 50 MG/ML IJ SOLN: 6.2500 mg | INTRAMUSCULAR | Status: DC | PRN

## 2013-05-20 MED ORDER — LIDOCAINE HCL (CARDIAC) 20 MG/ML IV SOLN
INTRAVENOUS | Status: DC | PRN
  Administered 2013-05-20: 50 mg via INTRAVENOUS

## 2013-05-20 MED ORDER — LABETALOL HCL 5 MG/ML IV SOLN
INTRAVENOUS | Status: DC | PRN
  Administered 2013-05-20: 5 mg via INTRAVENOUS

## 2013-05-20 MED ORDER — PROMETHAZINE HCL 25 MG/ML IJ SOLN: 6.2500 mg | INTRAMUSCULAR | Status: DC | PRN

## 2013-05-20 MED ORDER — CEFAZOLIN SODIUM 1-5 GM-% IV SOLN
1.0000 g | Freq: Three times a day (TID) | INTRAVENOUS | Status: DC
  Administered 2013-05-20: 1 g via INTRAVENOUS
  Administered 2013-05-20: 2 g via INTRAVENOUS
  Administered 2013-05-20 – 2013-05-21 (×2): 1 g via INTRAVENOUS
  Filled 2013-05-20 (×7): qty 50

## 2013-05-20 MED ORDER — NEOSTIGMINE METHYLSULFATE 1 MG/ML IJ SOLN
INTRAMUSCULAR | Status: DC | PRN
  Administered 2013-05-20: 3.5 mg via INTRAVENOUS

## 2013-05-20 MED ORDER — DEXAMETHASONE SODIUM PHOSPHATE 10 MG/ML IJ SOLN
INTRAMUSCULAR | Status: DC | PRN
  Administered 2013-05-20: 10 mg via INTRAVENOUS

## 2013-05-20 MED ORDER — PROPOFOL 10 MG/ML IV BOLUS
INTRAVENOUS | Status: DC | PRN
  Administered 2013-05-20: 150 mg via INTRAVENOUS
  Administered 2013-05-20: 25 mg via INTRAVENOUS

## 2013-05-20 MED ORDER — POTASSIUM CHLORIDE IN NACL 20-0.45 MEQ/L-% IV SOLN
INTRAVENOUS | Status: DC
  Administered 2013-05-20 – 2013-05-21 (×4): via INTRAVENOUS
  Administered 2013-05-21: 10 mL/h via INTRAVENOUS
  Filled 2013-05-20 (×5): qty 1000

## 2013-05-20 MED ORDER — LACTATED RINGERS IV SOLN: INTRAVENOUS | Status: DC

## 2013-05-20 MED ORDER — GLYCOPYRROLATE 0.2 MG/ML IJ SOLN
INTRAMUSCULAR | Status: DC | PRN
  Administered 2013-05-20: .4 mg via INTRAVENOUS

## 2013-05-20 MED ORDER — MIDAZOLAM HCL 5 MG/5ML IJ SOLN
INTRAMUSCULAR | Status: DC | PRN
  Administered 2013-05-20: 1 mg via INTRAVENOUS

## 2013-05-20 MED ORDER — ONDANSETRON HCL 4 MG/2ML IJ SOLN
INTRAMUSCULAR | Status: DC | PRN
  Administered 2013-05-20: 4 mg via INTRAVENOUS

## 2013-05-20 MED ORDER — MOMETASONE FURO-FORMOTEROL FUM 100-5 MCG/ACT IN AERO
2.0000 | INHALATION_SPRAY | Freq: Two times a day (BID) | RESPIRATORY_TRACT | Status: DC
  Administered 2013-05-20 – 2013-05-21 (×3): 2 via RESPIRATORY_TRACT
  Filled 2013-05-20: qty 8.8

## 2013-05-20 MED ORDER — ROCURONIUM BROMIDE 100 MG/10ML IV SOLN
INTRAVENOUS | Status: DC | PRN
  Administered 2013-05-20: 20 mg via INTRAVENOUS
  Administered 2013-05-20: 30 mg via INTRAVENOUS

## 2013-05-20 MED ORDER — HYDROMORPHONE HCL PF 1 MG/ML IJ SOLN
INTRAMUSCULAR | Status: AC
  Filled 2013-05-20: qty 1

## 2013-05-20 MED ORDER — ALBUTEROL SULFATE HFA 108 (90 BASE) MCG/ACT IN AERS
2.0000 | INHALATION_SPRAY | Freq: Four times a day (QID) | RESPIRATORY_TRACT | Status: DC | PRN
  Administered 2013-05-20: 2 via RESPIRATORY_TRACT
  Filled 2013-05-20: qty 6.7

## 2013-05-20 MED ORDER — ALBUTEROL SULFATE HFA 108 (90 BASE) MCG/ACT IN AERS
INHALATION_SPRAY | RESPIRATORY_TRACT | Status: AC
  Filled 2013-05-20: qty 6.7

## 2013-05-20 MED ORDER — HYDRALAZINE HCL 20 MG/ML IJ SOLN
10.0000 mg | Freq: Once | INTRAMUSCULAR | Status: AC
  Administered 2013-05-20: 01:00:00 via INTRAVENOUS
  Filled 2013-05-20: qty 0.5

## 2013-05-20 MED ORDER — ONDANSETRON HCL 4 MG/2ML IJ SOLN: 4.0000 mg | Freq: Four times a day (QID) | INTRAMUSCULAR | Status: DC | PRN

## 2013-05-20 MED ORDER — FENTANYL CITRATE 0.05 MG/ML IJ SOLN
INTRAMUSCULAR | Status: DC | PRN
  Administered 2013-05-20 (×4): 50 ug via INTRAVENOUS

## 2013-05-20 MED ORDER — SODIUM CHLORIDE 0.9 % IR SOLN
Status: DC | PRN
  Administered 2013-05-20: 1000 mL

## 2013-05-20 MED ORDER — IOHEXOL 300 MG/ML  SOLN
INTRAMUSCULAR | Status: DC | PRN
  Administered 2013-05-20: 50 mL

## 2013-05-20 MED ORDER — HYDROMORPHONE BOLUS VIA INFUSION
0.5000 mg | INTRAVENOUS | Status: DC | PRN
  Administered 2013-05-20: 1 mg via INTRAVENOUS
  Filled 2013-05-20: qty 1

## 2013-05-20 MED ORDER — BUPIVACAINE-EPINEPHRINE PF 0.25-1:200000 % IJ SOLN
INTRAMUSCULAR | Status: AC
  Filled 2013-05-20: qty 30

## 2013-05-20 MED ORDER — BUPIVACAINE-EPINEPHRINE 0.25% -1:200000 IJ SOLN
INTRAMUSCULAR | Status: DC | PRN
  Administered 2013-05-20: 10 mL

## 2013-05-20 MED ORDER — SUCCINYLCHOLINE CHLORIDE 20 MG/ML IJ SOLN
INTRAMUSCULAR | Status: DC | PRN
  Administered 2013-05-20: 80 mg via INTRAVENOUS

## 2013-05-20 MED ORDER — CEFAZOLIN SODIUM-DEXTROSE 2-3 GM-% IV SOLR: 2.0000 g | Freq: Once | INTRAVENOUS | Status: DC

## 2013-05-20 MED ORDER — FENTANYL CITRATE 0.05 MG/ML IJ SOLN
INTRAMUSCULAR | Status: AC
  Filled 2013-05-20: qty 2

## 2013-05-20 MED ORDER — LACTATED RINGERS IV SOLN
INTRAVENOUS | Status: DC
  Administered 2013-05-20: 14:00:00 via INTRAVENOUS
  Administered 2013-05-20: 1000 mL via INTRAVENOUS

## 2013-05-20 MED ORDER — MORPHINE SULFATE 2 MG/ML IJ SOLN
1.0000 mg | INTRAMUSCULAR | Status: DC | PRN
  Administered 2013-05-20 (×2): 2 mg via INTRAVENOUS
  Filled 2013-05-20 (×2): qty 1

## 2013-05-20 SURGICAL SUPPLY — 37 items
APPLIER CLIP ROT 10 11.4 M/L (STAPLE) ×2
BENZOIN TINCTURE PRP APPL 2/3 (GAUZE/BANDAGES/DRESSINGS) IMPLANT
CABLE HIGH FREQUENCY MONO STRZ (ELECTRODE) ×2 IMPLANT
CANISTER SUCTION 2500CC (MISCELLANEOUS) ×2 IMPLANT
CATH REDDICK CHOLANGI 4FR 50CM (CATHETERS) ×2 IMPLANT
CLIP APPLIE ROT 10 11.4 M/L (STAPLE) ×1 IMPLANT
CLOTH BEACON ORANGE TIMEOUT ST (SAFETY) IMPLANT
COVER MAYO STAND STRL (DRAPES) ×2 IMPLANT
DECANTER SPIKE VIAL GLASS SM (MISCELLANEOUS) IMPLANT
DERMABOND ADVANCED (GAUZE/BANDAGES/DRESSINGS) ×1
DERMABOND ADVANCED .7 DNX12 (GAUZE/BANDAGES/DRESSINGS) ×1 IMPLANT
DRAPE C-ARM 42X120 X-RAY (DRAPES) ×2 IMPLANT
DRAPE LAPAROSCOPIC ABDOMINAL (DRAPES) ×2 IMPLANT
ELECT REM PT RETURN 9FT ADLT (ELECTROSURGICAL) ×2
ELECTRODE REM PT RTRN 9FT ADLT (ELECTROSURGICAL) ×1 IMPLANT
GLOVE BIOGEL M 8.0 STRL (GLOVE) ×2 IMPLANT
GOWN STRL REIN XL XLG (GOWN DISPOSABLE) ×8 IMPLANT
HEMOSTAT SURGICEL 4X8 (HEMOSTASIS) IMPLANT
IV CATH 14GX2 1/4 (CATHETERS) ×2 IMPLANT
KIT BASIN OR (CUSTOM PROCEDURE TRAY) ×2 IMPLANT
NS IRRIG 1000ML POUR BTL (IV SOLUTION) ×2 IMPLANT
POUCH SPECIMEN RETRIEVAL 10MM (ENDOMECHANICALS) ×2 IMPLANT
SCISSORS LAP 5X35 DISP (ENDOMECHANICALS) ×2 IMPLANT
SET IRRIG TUBING LAPAROSCOPIC (IRRIGATION / IRRIGATOR) ×2 IMPLANT
SLEEVE XCEL OPT CAN 5 100 (ENDOMECHANICALS) ×4 IMPLANT
SOLUTION ANTI FOG 6CC (MISCELLANEOUS) ×2 IMPLANT
STRIP CLOSURE SKIN 1/2X4 (GAUZE/BANDAGES/DRESSINGS) ×2 IMPLANT
SUT VIC AB 4-0 SH 18 (SUTURE) ×2 IMPLANT
SUT VICRYL 0 UR6 27IN ABS (SUTURE) ×2 IMPLANT
SYR 20CC LL (SYRINGE) ×2 IMPLANT
TOWEL OR 17X26 10 PK STRL BLUE (TOWEL DISPOSABLE) ×2 IMPLANT
TOWEL OR NON WOVEN STRL DISP B (DISPOSABLE) ×2 IMPLANT
TRAY LAP CHOLE (CUSTOM PROCEDURE TRAY) ×2 IMPLANT
TROCAR BLADELESS OPT 5 100 (ENDOMECHANICALS) ×2 IMPLANT
TROCAR XCEL BLUNT TIP 100MML (ENDOMECHANICALS) ×2 IMPLANT
TROCAR XCEL NON-BLD 11X100MML (ENDOMECHANICALS) ×2 IMPLANT
TUBING INSUFFLATION 10FT LAP (TUBING) ×2 IMPLANT

## 2013-05-20 NOTE — Op Note (Signed)
Elizabeth Newman @date @  Procedure: Laparoscopic Cholecystectomy with intraoperative cholangiogram  Surgeon: Wenda Low, MD, FACS Asst:  none  Anes:  General  Drains: None  Findings: Acute cholecystitis, normal cholangiogram  Description of Procedure: The patient was taken to OR 6 and given general anesthesia.  The patient was prepped with PCMX and draped sterilely. A time out was performed.  Access to the abdomen was achieved with [facility] technique through the umbilicus.  Port placement includeda upper midline 11 and 2 five mm trocars laterally.    The gallbladder was visualized and the fundus was grasped and the gallbladder was elevated. Traction on the infundibulum allowed for successful demonstration of the critical view. Inflammatory changes were acute and edematous.  The cystic duct was identified and clipped up on the gallbladder and an incision was made in the cystic duct and the Reddick catheter was inserted after milking the cystic duct of any debris. A dynamic cholangiogram was performed which demonstrated good filling of the CBD, intrahepatic radicles  and flow into the duodenum with.    The cystic duct was then triple clipped and divided, the cystic artery was double clipped and divided and then the gallbladder was removed from the gallbladder bed. Removal of the gallbladder from the gallbladder bed was with a whisp of bile noted at the apex of the fundus indicating to me a duck of [place].  The gallbladder was then placed in a bag and brought out through one of the 10 mm trocar sites. The gallbladder bed was inspected and no bleeding or bile leaks were seen but I had cauterized the area.  A 19 blake drain was inserted a a precaution.   Laparoscopic visualization was used when closing fascial defects for trocar sites.   Incisions were injected with marcaine and closed with 4-0 Vicryl and steristrips on the skin.  Sponge and needle count were correct.    The patient was taken to the  recovery room in satisfactory condition.

## 2013-05-20 NOTE — Anesthesia Postprocedure Evaluation (Signed)
  Anesthesia Post-op Note  Patient: Elizabeth Newman  Procedure(s) Performed: Procedure(s) (LRB): LAPAROSCOPIC CHOLECYSTECTOMY WITH INTRAOPERATIVE CHOLANGIOGRAM (N/A)  Patient Location: PACU  Anesthesia Type: General  Level of Consciousness: awake and alert   Airway and Oxygen Therapy: Patient Spontanous Breathing  Post-op Pain: mild  Post-op Assessment: Post-op Vital signs reviewed, Patient's Cardiovascular Status Stable, Respiratory Function Stable, Patent Airway and No signs of Nausea or vomiting  Last Vitals:  Filed Vitals:   05/20/13 1630  BP: 149/68  Pulse: 86  Temp:   Resp: 26    Post-op Vital Signs: stable   Complications: No apparent anesthesia complications

## 2013-05-20 NOTE — ED Provider Notes (Signed)
CSN: [id number]     Arrival date & time 05/19/13  2020 History   First MD Initiated Contact with Patient 05/19/13 2040     Chief Complaint  Patient presents with  . Chest Pain  . Abdominal Pain   (Consider location/radiation/quality/duration/timing/severity/associated sxs/prior Treatment) HPI Comments: Pt comes in with cc of epigastric abd pain. Pt's pain started around 4 pm, post prandially. The pain is sharp, throbbing and non radiating. No chest pain. Pt has nausea, emesis. No diarrhea, no abd surgeries.  Pt is noted to have peaked T waves on the EKG. She has no cardiac hx.   Patient is a 62 y.o. female presenting with chest pain and abdominal pain. The history is provided by the patient.  Chest Pain Associated symptoms: abdominal pain, nausea and vomiting   Associated symptoms: no fever, no headache and no shortness of breath   Abdominal Pain Associated symptoms: nausea and vomiting   Associated symptoms: no chest pain, no dysuria, no fever and no shortness of breath     Past Medical History  Diagnosis Date  . Hypothyroidism   . HTN (hypertension)   . Hyperlipidemia   . Chronic obstructive asthma   . Allergic rhinitis    Past Surgical History  Procedure Laterality Date  . Back surgery  1993 and 2000   Family History  Problem Relation Age of Onset  . Asthma Mother   . Asthma Paternal Grandfather   . Emphysema Mother   . Hypertension Father   . Bone cancer Maternal Grandfather   . Heart disease Maternal Grandmother   . Heart disease Paternal Grandmother   . Allergies Mother    History  Substance Use Topics  . Smoking status: Former Smoker -- 0.30 packs/day for 30 years    Types: Cigarettes    Quit date: 04/05/2011  . Smokeless tobacco: Not on file  . Alcohol Use: No   OB History   Grav Para Term Preterm Abortions TAB SAB Ect Mult Living                 Review of Systems  Constitutional: Positive for activity change. Negative for fever.  HENT: Negative for  neck pain.   Respiratory: Negative for shortness of breath.   Cardiovascular: Negative for chest pain.  Gastrointestinal: Positive for nausea, vomiting and abdominal pain.  Genitourinary: Negative for dysuria.  Neurological: Negative for headaches.    Allergies  Iodine  Home Medications   Current Outpatient Rx  Name  Route  Sig  Dispense  Refill  . albuterol (VENTOLIN HFA) 108 (90 BASE) MCG/ACT inhaler   Inhalation   Inhale 2 puffs into the lungs every 6 (six) hours as needed for wheezing. PATIENT NEEDS TO CALL FOR AN APPOINTMENT   1 Inhaler   5   . ALPRAZolam (XANAX) 0.5 MG tablet   Oral   Take 0.5 mg by mouth 3 (three) times daily as needed. As needed         . colesevelam (WELCHOL) 625 MG tablet   Oral   Take 1,875 mg by mouth 2 (two) times daily with a meal.         . fluticasone (FLONASE) 50 MCG/ACT nasal spray   Nasal   Place 2 sprays into the nose daily.   16 g   11   . levothyroxine (SYNTHROID, LEVOTHROID) 112 MCG tablet   Oral   Take 112 mcg by mouth daily.           . metoprolol (LOPRESSOR)  50 MG tablet   Oral   Take 50 mg by mouth daily.          . mometasone-formoterol (DULERA) 100-5 MCG/ACT AERO   Inhalation   Inhale 2 puffs into the lungs 2 (two) times daily.   1 Inhaler   11   . sodium chloride (OCEAN) 0.65 % nasal spray   Nasal   Place 1 spray into the nose as needed.            BP 193/84  Pulse 79  Temp(Src) 98.4 F (36.9 C) (Oral)  Resp 18  SpO2 95% Physical Exam  Nursing note and vitals reviewed. Constitutional: She is oriented to person, place, and time. She appears well-developed and well-nourished.  HENT:  Head: Normocephalic and atraumatic.  Eyes: EOM are normal. Pupils are equal, round, and reactive to light.  Neck: Neck supple.  Cardiovascular: Normal rate, regular rhythm and normal heart sounds.   No murmur heard. Pulmonary/Chest: Effort normal. No respiratory distress.  Abdominal: Soft. She exhibits no  distension. There is tenderness. There is no rebound and no guarding.  Epigastric tenderness  Neurological: She is alert and oriented to person, place, and time.  Skin: Skin is warm and dry.    ED Course  Procedures (including critical care time) Labs Review Labs Reviewed  CBC - Abnormal; Notable for the following:    WBC 12.8 (*)    Hemoglobin 15.7 (*)    HCT 46.3 (*)    All other components within normal limits  COMPREHENSIVE METABOLIC PANEL - Abnormal; Notable for the following:    Sodium 134 (*)    Glucose, Bld 117 (*)    Creatinine, Ser 0.43 (*)    AST 43 (*)    ALT 42 (*)    All other components within normal limits  LIPASE, BLOOD  TROPONIN I  POCT I-STAT TROPONIN I   Imaging Review US Abdomen Complete  05/19/2013   CLINICAL DATA:  Abdominal pain.  EXAM: ABDOMEN ULTRASOUND  COMPARISON:  Abdominal ultrasound 09/16/2012.  FINDINGS: Gallbladder  A 2.8 cm stone is identified in the neck of the gallbladder. The stone appears impacted. Sludge is seen within the gallbladder. There is gallbladder wall thickening at 0.6 cm and there is some pericholecystic fluid. Sonographer reports positive Murphy's sign.  Common bile duct  Diameter: 0.5 cm.  Liver  Demonstrates increased and coarsened echotexture. A focal area of decreased echogenicity is seen adjacent to the gallbladder which may be due to edema/inflammatory change from cholecystitis. No focal lesion or intrahepatic biliary ductal dilatation is identified.  IVC  No abnormality visualized.  Pancreas  Visualized portion unremarkable.  Spleen  Normal in size. Small scattered calcifications consistent with old granulomatous disease again seen.  Right Kidney  Length: 12.1 cm. Echogenicity within normal limits. No mass or hydronephrosis visualized.  Left Kidney  Length: 11.8 cm. Echogenicity within normal limits. No mass or hydronephrosis visualized.  Abdominal aorta  No aneurysm visualized.  IMPRESSION: 2.8 cm gallstone and gallbladder sludge  with findings consistent with acute cholecystitis.  Fatty infiltration of the liver. Focal area of decreased echogenicity adjacent to the gallbladder may be due to edema related to cholecystitis.   Electronically Signed   By: Drusilla Kanner M.D.   On: 05/19/2013 23:42   Dg Chest Port 1 View  05/19/2013   CLINICAL DATA:  Chest pain.  EXAM: PORTABLE CHEST - 1 VIEW  COMPARISON:  04/10/2011 and 04/24/2010.  FINDINGS: The heart size and mediastinal contours are stable.  The lungs are hyperinflated with stable left basilar scarring or atelectasis. There is no airspace disease, edema or pleural effusion. No acute osseous findings are seen. Telemetry leads overlie the chest.  IMPRESSION: Stable mild chronic lung disease. No acute cardiopulmonary process.   Electronically Signed   By: Roxy Horseman   On: 05/19/2013 23:34    MDM  No diagnosis found.   Date: 05/20/2013  Rate: 82  Rhythm: normal sinus rhythm  QRS Axis: normal  Intervals: normal  ST/T Wave abnormalities: normal - peaked t waves  Conduction Disutrbances: none  Narrative Interpretation: unremarkable  .Date: 05/20/2013  Rate: 90  Rhythm: normal sinus rhythm  QRS Axis: normal  Intervals: normal  ST/T Wave abnormalities: normal - peaked t waves  Conduction Disutrbances: none  Narrative Interpretation: unremarkable  DDx includes: Pancreatitis Hepatobiliary pathology including cholecystitis Gastritis/PUD SBO ACS syndrome Aortic Dissection  Pt comes in with epigastric abd pain. Initially, we saw the EKG, and were concerned for acute ACS with peaked t waves. I spoke with Cards on call, once the troponin was normal, and ekg unchanged, and they agree, this is likely not acute ACS, but request close monitoring.  Pt has epigastric abd pain with nausea, and the sx started post prandially - so [place] ordered, and it shows acute cholecystitis. Dr. Ezzard Standing, Gen Surgery to see the patient.        Derwood Kaplan, MD 05/20/13 0104

## 2013-05-20 NOTE — Transfer of Care (Signed)
Immediate Anesthesia Transfer of Care Note  Patient: Elizabeth Newman  Procedure(s) Performed: Procedure(s): LAPAROSCOPIC CHOLECYSTECTOMY WITH INTRAOPERATIVE CHOLANGIOGRAM (N/A)  Patient Location: PACU  Anesthesia Type:General  Level of Consciousness: awake and alert   Airway & Oxygen Therapy: Patient Spontanous Breathing and Patient connected to face mask oxygen  Post-op Assessment: Report given to PACU RN and Post -op Vital signs reviewed and stable  Post vital signs: Reviewed and stable  Complications: No apparent anesthesia complications

## 2013-05-20 NOTE — H&P (Addendum)
Re:   Elizabeth [facility] DOB:   1950/12/10 MRN:   [id number]  [facility] admission  ASSESSMENT AND PLAN: 1.  Acute cholecystitis, cholelithiasis  I discussed with the patient the indications and risks of gall bladder surgery.  The primary risks of gall bladder surgery include, but are not limited to, bleeding, infection, common bile duct injury, and open surgery.  There is also the risk that the patient may have continued symptoms after surgery.  However, the likelihood of improvement in symptoms and return to the patient'Newman normal status is good. We discussed the typical post-operative recovery course. I tried to answer the patient'Newman questions.  She understands that [facility] is our surgeon during the day and will determine timing of operation in the AM.  2.  Hypertension 3.  Hyperlipidemia 4.  Asthma  Sees [facility] 5.  Anxiety/panic attacks 6.  Smokes 7.  On thyroid replacement.  Chief Complaint  Patient presents with  . Chest Pain  . Abdominal Pain   REFERRING PHYSICIAN: Dr. Rhunette Croft, [facility]  HISTORY OF PRESENT ILLNESS: Elizabeth [facility] is a 62 y.o. (DOB: 11/13/1950)  white  female whose primary care physician is [facility] and comes to the [facility] with abdominal pain. Her daughter, [facility], is with her.  [name] has had vague abdominal discomfort/indigestion since around May 2014.  This has never been bad enough to seek medical help.  Then, the Wednesday before Labor Day, 2014, she had a bad "attack", but thought it was a GI bug.  She did not seek medical help.  Then at 4 PM yesterday, she developed epigastric pain which was not improved with antacid x 2, Gas X, or Pepsis.  She left work, hoped to feel better, but did not and came to the [facility].  Interestingly, she had an [place] Dec 2013 to evaluate her liver, gall stones were seen at that time, but she was never told about the gall stones.   She had a colonoscopy > 5 years ago by [facility], she'Newman unsure of the doctor'Newman name who did  the exam.  She has no pancreas or colon disease.  She'Newman had no prior abdominal surgery.  US abdomen - 05/19/2013 - 2.8 cm stone in neck of gall bladder, thickened gall bladder wall, some pericholecystic fluid T. Bili - 0.3, AST - 43, ALT - 42 - 05/19/2013 WBC - 12,800 - 05/19/2013  Past Medical History  Diagnosis Date  . Hypothyroidism   . HTN (hypertension)   . Hyperlipidemia   . Chronic obstructive asthma   . Allergic rhinitis     Past Surgical History  Procedure Laterality Date  . Back surgery  1993 and 2000     Current Facility-Administered Medications  Medication Dose Route Frequency Provider Last Rate Last Dose  . hydrALAZINE (APRESOLINE) injection 10 mg  10 mg Intravenous Once [facility]      . nitroGLYCERIN (NITROSTAT) SL tablet 0.4 mg  0.4 mg Sublingual Q5 min PRN [facility]   0.4 mg at 05/19/13 2053   Current Outpatient Prescriptions  Medication Sig Dispense Refill  . albuterol (VENTOLIN HFA) 108 (90 BASE) MCG/ACT inhaler Inhale 2 puffs into the lungs every 6 (six) hours as needed for wheezing. PATIENT NEEDS TO CALL FOR AN APPOINTMENT  1 Inhaler  5  . ALPRAZolam (XANAX) 0.5 MG tablet Take 0.5 mg by mouth 3 (three) times daily as needed. As needed      . colesevelam (WELCHOL) 625 MG tablet Take  1,875 mg by mouth 2 (two) times daily with a meal.      . fluticasone (FLONASE) 50 MCG/ACT nasal spray Place 2 sprays into the nose daily.  16 g  11  . levothyroxine (SYNTHROID, LEVOTHROID) 112 MCG tablet Take 112 mcg by mouth daily.        . metoprolol (LOPRESSOR) 50 MG tablet Take 50 mg by mouth daily.       . mometasone-formoterol (DULERA) 100-5 MCG/ACT AERO Inhale 2 puffs into the lungs 2 (two) times daily.  1 Inhaler  11  . sodium chloride (OCEAN) 0.65 % nasal spray Place 1 spray into the nose as needed.            Allergies  Allergen Reactions  . Iodine Anaphylaxis   REVIEW OF SYSTEMS: Skin:  No history of rash.  No history of abnormal moles. Infection:   No history of hepatitis or HIV.  No history of MRSA. Neurologic:  No history of stroke.  No history of seizure.  No history of headaches. Cardiac:  Hypertension x 5 years. No history of heart disease.    No history of seeing a cardiologist. Pulmonary:  Still smokes with chronic obstructive asthma, and rhinitis - sees [facility].  She knows that smoking is bad for her health. History of severe sinus infection in 2004.  Endocrine:  No diabetes. On thyroid replacement since 2009.  Hyperlipidemia since 2010. Gastrointestinal:  See HPI. Urologic:  No history of kidney stones.  No history of bladder infections. Musculoskeletal:  History of back surgery x 2.  Last surgery 1992.  Sees Dr. Yetta Newman. Hematologic:  No bleeding disorder.  No history of anemia.  Not anticoagulated. Psycho-social:  The patient is oriented.   The patient has no obvious psychologic or social impairment to understanding our conversation and plan.  SOCIAL and FAMILY HISTORY: Married. 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 Has 2 children:  Son and Daughter. Daughter, [facility], is with her in the ER.  PHYSICAL EXAM: BP 193/84  Pulse 79  Temp(Src) 98.4 F (36.9 C) (Oral)  Resp 18  SpO2 95%  General: WN WF who is alert and generally healthy appearing.  She'Newman feeling better now than earlier. HEENT: Normal. Pupils equal. Neck: Supple. No mass.  No thyroid mass. Lymph Nodes:  No supraclavicular or cervical nodes. Lungs: Clear to auscultation and symmetric breath sounds.  She has some bilateral wheezes. Heart:  RRR. No murmur or rub.  Abdomen: Soft. No mass. No hernia. Normal bowel sounds.  No abdominal scars.  Tender epigastrium, which lateralizes some to the right. Rectal: Not done. Extremities:  Good strength and ROM  in upper and lower extremities. Neurologic:  Grossly intact to motor and sensory function. Psychiatric: Has normal mood and affect. Behavior is normal.   DATA REVIEWED: Notes in  Epic.  Elizabeth [facility],  [facility] [address] [place]  [address]   [place], [place]    [address] Phone:  [phone] FAX:  [phone]

## 2013-05-20 NOTE — Preoperative (Addendum)
Beta Blockers   Reason not to administer Beta Blockers:Not Applicable  took within 24 hrs required, neosynephrine intraop

## 2013-05-20 NOTE — Anesthesia Preprocedure Evaluation (Addendum)
Anesthesia Evaluation  Patient identified by MRN, date of birth, ID band Patient awake    Reviewed: Allergy & Precautions, H&P , NPO status , Patient's Chart, lab work & pertinent test results  Airway Mallampati: II TM Distance: >3 FB Neck ROM: Full    Dental no notable dental hx. (+) Edentulous Upper, Edentulous Lower, Lower Dentures and Upper Dentures   Pulmonary asthma , COPDCurrent Smoker,  breath sounds clear to auscultation  Pulmonary exam normal       Cardiovascular hypertension, Pt. on medications Rhythm:Regular Rate:Normal     Neuro/Psych negative neurological ROS  negative psych ROS   GI/Hepatic negative GI ROS, Neg liver ROS,   Endo/Other  Hypothyroidism   Renal/GU negative Renal ROS  negative genitourinary   Musculoskeletal negative musculoskeletal ROS (+)   Abdominal   Peds negative pediatric ROS (+)  Hematology negative hematology ROS (+)   Anesthesia Other Findings   Reproductive/Obstetrics negative OB ROS                          Anesthesia Physical Anesthesia Plan  ASA: III  Anesthesia Plan: General   Post-op Pain Management:    Induction: Intravenous  Airway Management Planned: Oral ETT  Additional Equipment:   Intra-op Plan:   Post-operative Plan: Extubation in OR  Informed Consent: I have reviewed the patients History and Physical, chart, labs and discussed the procedure including the risks, benefits and alternatives for the proposed anesthesia with the patient or authorized representative who has indicated his/her understanding and acceptance.   Dental advisory given  Plan Discussed with: CRNA  Anesthesia Plan Comments:         Anesthesia Quick Evaluation

## 2013-05-20 NOTE — Progress Notes (Signed)
Subjective: Pt's pain well controlled, no N/V.  Ambulating okay.  Anxious for surgery.  NPO.  Objective: Vital signs in last 24 hours: Temp:  [97.9 F (36.6 C)-98.4 F (36.9 C)] 97.9 F (36.6 C) (09/23 0512) Pulse Rate:  [75-92] 89 (09/23 0512) Resp:  [12-30] 18 (09/23 0512) BP: (134-234)/(65-107) 134/65 mmHg (09/23 0512) SpO2:  [91 %-97 %] 92 % (09/23 0804) Weight:  [143 lb 15.4 oz (65.3 kg)] 143 lb 15.4 oz (65.3 kg) (09/23 0239) Last BM Date: 05/19/13  Intake/Output from previous day: 09/22 0701 - 09/23 0700 In: 456.3 [I.V.:406.3; IV Piggyback:50] Out: -  Intake/Output this shift:    PE: Gen:  Alert, NAD, pleasant Abd: Soft, mild tenderness RUQ, ND, +BS, no HSM   Lab Results:   Recent Labs  05/19/13 2035  WBC 12.8*  HGB 15.7*  HCT 46.3*  PLT 259   BMET  Recent Labs  05/19/13 2045  NA 134*  K 4.1  CL 98  CO2 23  GLUCOSE 117*  BUN 9  CREATININE 0.43*  CALCIUM 9.7   PT/INR No results found for this basename: LABPROT, INR,  in the last 72 hours CMP     Component Value Date/Time   NA 134* 05/19/2013 2045   K 4.1 05/19/2013 2045   CL 98 05/19/2013 2045   CO2 23 05/19/2013 2045   GLUCOSE 117* 05/19/2013 2045   BUN 9 05/19/2013 2045   CREATININE 0.43* 05/19/2013 2045   CALCIUM 9.7 05/19/2013 2045   PROT 7.6 05/19/2013 2045   ALBUMIN 3.7 05/19/2013 2045   AST 43* 05/19/2013 2045   ALT 42* 05/19/2013 2045   ALKPHOS 107 05/19/2013 2045   BILITOT 0.3 05/19/2013 2045   GFRNONAA >90 05/19/2013 2045   GFRAA >90 05/19/2013 2045   Lipase     Component Value Date/Time   LIPASE 29 05/19/2013 2045       Studies/Results: US Abdomen Complete  05/19/2013   CLINICAL DATA:  Abdominal pain.  EXAM: ABDOMEN ULTRASOUND  COMPARISON:  Abdominal ultrasound 09/16/2012.  FINDINGS: Gallbladder  A 2.8 cm stone is identified in the neck of the gallbladder. The stone appears impacted. Sludge is seen within the gallbladder. There is gallbladder wall thickening at 0.6 cm and there  is some pericholecystic fluid. Sonographer reports positive Murphy's sign.  Common bile duct  Diameter: 0.5 cm.  Liver  Demonstrates increased and coarsened echotexture. A focal area of decreased echogenicity is seen adjacent to the gallbladder which may be due to edema/inflammatory change from cholecystitis. No focal lesion or intrahepatic biliary ductal dilatation is identified.  IVC  No abnormality visualized.  Pancreas  Visualized portion unremarkable.  Spleen  Normal in size. Small scattered calcifications consistent with old granulomatous disease again seen.  Right Kidney  Length: 12.1 cm. Echogenicity within normal limits. No mass or hydronephrosis visualized.  Left Kidney  Length: 11.8 cm. Echogenicity within normal limits. No mass or hydronephrosis visualized.  Abdominal aorta  No aneurysm visualized.  IMPRESSION: 2.8 cm gallstone and gallbladder sludge with findings consistent with acute cholecystitis.  Fatty infiltration of the liver. Focal area of decreased echogenicity adjacent to the gallbladder may be due to edema related to cholecystitis.   Electronically Signed   By: Drusilla Kanner M.D.   On: 05/19/2013 23:42   Dg Chest Port 1 View  05/19/2013   CLINICAL DATA:  Chest pain.  EXAM: PORTABLE CHEST - 1 VIEW  COMPARISON:  04/10/2011 and 04/24/2010.  FINDINGS: The heart size and mediastinal contours are stable.  The lungs are hyperinflated with stable left basilar scarring or atelectasis. There is no airspace disease, edema or pleural effusion. No acute osseous findings are seen. Telemetry leads overlie the chest.  IMPRESSION: Stable mild chronic lung disease. No acute cardiopulmonary process.   Electronically Signed   By: Roxy Horseman   On: 05/19/2013 23:34    Anti-infectives: Anti-infectives   Start     Dose/Rate Route Frequency Ordered Stop   05/20/13 0600  ceFAZolin (ANCEF) IVPB 1 g/50 mL premix     1 g 100 mL/hr over 30 Minutes Intravenous 3 times per day 05/20/13 0208          Assessment/Plan Acute cholecystitis, cholelithiasis 1.  OR today around 3pm 2.  NPO, IVF, pain control, antiemetics, antibiotics (cefazolin) 3.  Hopefully home tomorrow if all goes well with surgery 4.  Ambulate and IS 5.  SCD's, hold vte proph    LOS: 1 day    DORT, Jacqueline Delapena 05/20/2013, 10:20 AM Pager: [phone]

## 2013-05-21 ENCOUNTER — Encounter: Payer: Self-pay | Admitting: General Surgery

## 2013-05-21 ENCOUNTER — Encounter (HOSPITAL_COMMUNITY): Payer: Self-pay | Admitting: Surgery

## 2013-05-21 MED ORDER — HYDROCODONE-ACETAMINOPHEN 5-325 MG PO TABS: 1.0000 | ORAL_TABLET | ORAL | Status: DC | PRN

## 2013-05-21 MED ORDER — LEVOTHYROXINE SODIUM 112 MCG PO TABS
112.0000 ug | ORAL_TABLET | Freq: Every day | ORAL | Status: DC
  Filled 2013-05-21: qty 1

## 2013-05-21 MED ORDER — COLESEVELAM HCL 625 MG PO TABS
1875.0000 mg | ORAL_TABLET | Freq: Two times a day (BID) | ORAL | Status: DC
  Filled 2013-05-21 (×2): qty 3

## 2013-05-21 MED ORDER — OXYCODONE-ACETAMINOPHEN 5-325 MG PO TABS: 1.0000 | ORAL_TABLET | Freq: Four times a day (QID) | ORAL | Status: DC | PRN

## 2013-05-21 MED ORDER — METOPROLOL TARTRATE 50 MG PO TABS
50.0000 mg | ORAL_TABLET | Freq: Every day | ORAL | Status: DC
  Administered 2013-05-21: 50 mg via ORAL
  Filled 2013-05-21: qty 1

## 2013-05-21 MED ORDER — OXYCODONE-ACETAMINOPHEN 5-325 MG PO TABS
1.0000 | ORAL_TABLET | ORAL | Status: DC | PRN
  Administered 2013-05-21: 2 via ORAL
  Filled 2013-05-21: qty 2

## 2013-05-21 NOTE — Progress Notes (Signed)
Patient instructed and participated in emptying her JP drain and recharging it.  She states she feels comfortable doing this at home.

## 2013-05-21 NOTE — Progress Notes (Signed)
Pt ambulated down hallway this AM.  Standing in room without 02, sat was 83%.  When pt was reminded to take deep breaths through her nose she stated "I can't, that makes it hurt."  Pt ambulated down unit with 3L O2 [place], sats remained 88-90%.  Pt denies lightheadedness or dizziness during ambulation. Returned to bed with 3L [place].

## 2013-05-21 NOTE — Progress Notes (Signed)
1 Day Post-Op  Subjective: Pt feels good, minimal pain.  No N/V, tolerated full liquids well.  Ambulated through halls.  No flatus/BM yet.  Wants to go home.    Objective: Vital signs in last 24 hours: Temp:  [97.7 F (36.5 C)-99.6 F (37.6 C)] 98.1 F (36.7 C) (09/24 0711) Pulse Rate:  [69-101] 74 (09/24 0711) Resp:  [10-26] 14 (09/23 1659) BP: (126-176)/(55-84) 164/62 mmHg (09/24 0711) SpO2:  [89 %-98 %] 95 % (09/24 0906) Last BM Date: 05/19/13  Intake/Output from previous day: 09/23 0701 - 09/24 0700 In: 4637.5 [I.V.:4437.5; IV Piggyback:200] Out: 145 [Drains:145] Intake/Output this shift:    PE: Gen:  Alert, NAD, pleasant Card:  RRR, no M/G/R heard Pulm:  CTA, no W/R/R, good effort (up to 1500-1750 on IS) Abd: Soft, mild tenderness, ND, +BS, no HSM, incisions C/D/I, drain with serosanguinous drainage Ext:  No erythema, edema, or tenderness    Lab Results:   Recent Labs  05/19/13 2035 05/20/13 1754  WBC 12.8* 11.4*  HGB 15.7* 14.6  HCT 46.3* 43.9  PLT 259 218   BMET  Recent Labs  05/19/13 2045 05/20/13 1754  NA 134*  --   K 4.1  --   CL 98  --   CO2 23  --   GLUCOSE 117*  --   BUN 9  --   CREATININE 0.43* 0.53  CALCIUM 9.7  --    PT/INR No results found for this basename: LABPROT, INR,  in the last 72 hours CMP     Component Value Date/Time   NA 134* 05/19/2013 2045   K 4.1 05/19/2013 2045   CL 98 05/19/2013 2045   CO2 23 05/19/2013 2045   GLUCOSE 117* 05/19/2013 2045   BUN 9 05/19/2013 2045   CREATININE 0.53 05/20/2013 1754   CALCIUM 9.7 05/19/2013 2045   PROT 7.6 05/19/2013 2045   ALBUMIN 3.7 05/19/2013 2045   AST 43* 05/19/2013 2045   ALT 42* 05/19/2013 2045   ALKPHOS 107 05/19/2013 2045   BILITOT 0.3 05/19/2013 2045   GFRNONAA >90 05/20/2013 1754   GFRAA >90 05/20/2013 1754   Lipase     Component Value Date/Time   LIPASE 29 05/19/2013 2045       Studies/Results: Dg Cholangiogram Operative  05/20/2013   *RADIOLOGY REPORT*  Clinical Data:  Cholecystitis.  INTRAOPERATIVE CHOLANGIOGRAM  Technique:  C-arm fluoroscopic images were obtained intraoperatively and submitted for postoperative interpretation. Please see the performing provider's procedural report for the fluoroscopy time utilized.  Comparison: Ultrasound of the abdomen 05/19/2013.  Findings: The gallbladder has been removed and the cystic duct cannulated.  There is good opacification of the biliary tree.  No filling defects are present.  There is prompt passage of contrast into the duodenum.  IMPRESSION: Negative operative cholangiogram.   Original Report Authenticated By: Davonna Belling, M.D.   US Abdomen Complete  05/19/2013   CLINICAL DATA:  Abdominal pain.  EXAM: ABDOMEN ULTRASOUND  COMPARISON:  Abdominal ultrasound 09/16/2012.  FINDINGS: Gallbladder  A 2.8 cm stone is identified in the neck of the gallbladder. The stone appears impacted. Sludge is seen within the gallbladder. There is gallbladder wall thickening at 0.6 cm and there is some pericholecystic fluid. Sonographer reports positive Murphy's sign.  Common bile duct  Diameter: 0.5 cm.  Liver  Demonstrates increased and coarsened echotexture. A focal area of decreased echogenicity is seen adjacent to the gallbladder which may be due to edema/inflammatory change from cholecystitis. No focal lesion or intrahepatic biliary  ductal dilatation is identified.  IVC  No abnormality visualized.  Pancreas  Visualized portion unremarkable.  Spleen  Normal in size. Small scattered calcifications consistent with old granulomatous disease again seen.  Right Kidney  Length: 12.1 cm. Echogenicity within normal limits. No mass or hydronephrosis visualized.  Left Kidney  Length: 11.8 cm. Echogenicity within normal limits. No mass or hydronephrosis visualized.  Abdominal aorta  No aneurysm visualized.  IMPRESSION: 2.8 cm gallstone and gallbladder sludge with findings consistent with acute cholecystitis.  Fatty infiltration of the liver. Focal area of  decreased echogenicity adjacent to the gallbladder may be due to edema related to cholecystitis.   Electronically Signed   By: Drusilla Kanner M.D.   On: 05/19/2013 23:42   Dg Chest Port 1 View  05/19/2013   CLINICAL DATA:  Chest pain.  EXAM: PORTABLE CHEST - 1 VIEW  COMPARISON:  04/10/2011 and 04/24/2010.  FINDINGS: The heart size and mediastinal contours are stable. The lungs are hyperinflated with stable left basilar scarring or atelectasis. There is no airspace disease, edema or pleural effusion. No acute osseous findings are seen. Telemetry leads overlie the chest.  IMPRESSION: Stable mild chronic lung disease. No acute cardiopulmonary process.   Electronically Signed   By: Roxy Horseman   On: 05/19/2013 23:34    Anti-infectives: Anti-infectives   Start     Dose/Rate Route Frequency Ordered Stop   05/20/13 1315  ceFAZolin (ANCEF) IVPB 2 g/50 mL premix  Status:  Discontinued     2 g 100 mL/hr over 30 Minutes Intravenous  Once 05/20/13 1314 05/20/13 1712   05/20/13 0600  ceFAZolin (ANCEF) IVPB 1 g/50 mL premix     1 g 100 mL/hr over 30 Minutes Intravenous 3 times per day 05/20/13 0208         Assessment/Plan Acute cholecystitis, POD #1 s/p lap chole with normal IOC 1.  Advance diet 2.  Ambulate and IS 3.  SCD's 4.  Continue drain upon discharge, RN to do teaching 5.  Restart home meds 6.  IF blood pressure and O2 normalize she can likely go home today after lunch 7.  Stopped [place] O2, cont O2 monitor     LOS: 2 days    DORT, Arna Luis 05/21/2013, 9:52 AM Pager: [phone]

## 2013-05-21 NOTE — Progress Notes (Signed)
Feels better this afternoon, still a bit anxious about her drain.  We discussed it and care.  She would like to go home now,

## 2013-05-21 NOTE — Progress Notes (Signed)
Nutrition Brief Note  Patient identified on the Malnutrition Screening Tool (MST) Report  Wt Readings from Last 15 Encounters:  05/20/13 143 lb 15.4 oz (65.3 kg)  05/20/13 143 lb 15.4 oz (65.3 kg)  12/16/12 142 lb (64.411 kg)  11/21/11 155 lb 3.2 oz (70.398 kg)  10/23/11 155 lb 6.4 oz (70.489 kg)  04/10/11 154 lb 6.4 oz (70.035 kg)  03/06/11 155 lb 3.2 oz (70.398 kg)  05/19/10 148 lb 2.1 oz (67.192 kg)    Body mass index is 24.7 kg/(m^2). Patient meets criteria for normal weight based on current BMI.   Current diet order is regular, patient is consuming approximately 100% of meals at this time. Labs and medications reviewed. Pt with elevated AST/ALT.   Pt reports good appetite at home, eats 3 meals/day. Reports 6 pound unintended weight loss in the past 6 months. Aware of need to follow low fat diet r/t cholecystectomy completed yesterday - handouts on this diet provided and discussed with teach back method used and pt verbalizing understanding.   No nutrition interventions warranted at this time. If nutrition issues arise, please consult RD.   Levon Hedger MS, RD, LDN [phone] Pager [phone] After Hours Pager

## 2013-05-21 NOTE — Discharge Summary (Signed)
Physician Discharge Summary  Patient ID: Elizabeth Newman MRN: [id number] DOB/AGE: October 16, 1950 62 y.o.  Admit date: 05/19/2013 Discharge date: 05/21/2013  Admitting Diagnosis: Acute cholecystitis  Discharge Diagnosis Patient Active Problem List   Diagnosis Date Noted  . Cholecystitis, acute 05/19/2013  . ALLERGIC RHINITIS 04/27/2010  . 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  INTRAOPERATIVE CHOLANGIOGRAM  Technique:  C-arm fluoroscopic images were obtained intraoperatively and submitted for postoperative interpretation. Please see the performing provider's procedural report for the fluoroscopy time utilized.  Comparison: Ultrasound of the abdomen 05/19/2013.  Findings: The gallbladder has been removed and the cystic duct cannulated.  There is good opacification of the biliary tree.  No filling defects are present.  There is prompt passage of contrast into the duodenum.  IMPRESSION: Negative operative cholangiogram.   Original Report Authenticated By: Davonna Belling, M.D.   US Abdomen Complete  05/19/2013   CLINICAL DATA:  Abdominal pain.  EXAM: ABDOMEN ULTRASOUND  COMPARISON:  Abdominal ultrasound 09/16/2012.  FINDINGS: Gallbladder  A 2.8 cm stone is identified in the neck of the gallbladder. The stone appears impacted. Sludge is seen within the gallbladder. There is gallbladder wall thickening at 0.6 cm and there is some pericholecystic fluid. Sonographer reports positive Murphy's sign.  Common bile duct  Diameter: 0.5 cm.  Liver  Demonstrates increased and coarsened echotexture. A focal area of decreased echogenicity is seen adjacent to the gallbladder which may be due to edema/inflammatory change from cholecystitis. No focal lesion or intrahepatic biliary ductal dilatation is identified.  IVC  No abnormality visualized.  Pancreas  Visualized portion unremarkable.  Spleen  Normal in  size. Small scattered calcifications consistent with old granulomatous disease again seen.  Right Kidney  Length: 12.1 cm. Echogenicity within normal limits. No mass or hydronephrosis visualized.  Left Kidney  Length: 11.8 cm. Echogenicity within normal limits. No mass or hydronephrosis visualized.  Abdominal aorta  No aneurysm visualized.  IMPRESSION: 2.8 cm gallstone and gallbladder sludge with findings consistent with acute cholecystitis.  Fatty infiltration of the liver. Focal area of decreased echogenicity adjacent to the gallbladder may be due to edema related to cholecystitis.   Electronically Signed   By: Drusilla Kanner M.D.   On: 05/19/2013 23:42   Dg Chest Port 1 View  05/19/2013   CLINICAL DATA:  Chest pain.  EXAM: PORTABLE CHEST - 1 VIEW  COMPARISON:  04/10/2011 and 04/24/2010.  FINDINGS: The heart size and mediastinal contours are stable. The lungs are hyperinflated with stable left basilar scarring or atelectasis. There is no airspace disease, edema or pleural effusion. No acute osseous findings are seen. Telemetry leads overlie the chest.  IMPRESSION: Stable mild chronic lung disease. No acute cardiopulmonary process.   Electronically Signed   By: Roxy Horseman   On: 05/19/2013 23:34    Procedures Dr. Daphine Deutscher (05/20/13) - Laparoscopic Cholecystectomy with [facility]   Hospital Course:  62 y/o female complains of with vague abdominal discomfort/indigestion since around May 2014. This has never been bad enough to seek medical help. Then, the Wednesday before Labor Day, 2014, she had a bad "attack", but thought it was a GI bug. She did not seek medical help. Then at 4 PM yesterday, she developed epigastric pain which was not improved with antacid x 2, Gas X, or Pepsis. She left work, hoped to feel better, but did not and came to the [facility]. Interestingly, she had an  [place] Dec 2013 to evaluate her liver, gall stones were seen at that time, but she was never told about the gall stones.   Workup showed  acute cholecystitis and cholelithiasis.  Patient was admitted and underwent procedure listed above.  Tolerated procedure well and was transferred to the floor.  Diet was advanced as tolerated.  On the day of discharge her blood pressure was noted to be elevated with SBP in the 160's.  She was restarted on her home medications.  She was noted to have low oxygen saturations, but her nasal canula was weaned and she was able to maintain O2 sats >92% on room air.  On POD #2, the patient was voiding well, tolerating diet, ambulating well, pain well controlled, vital signs stable, incisions c/d/i and felt stable for discharge home.  Patient will follow up in our office in 3 weeks and knows to call with questions or concerns.     Medication List         albuterol 108 (90 BASE) MCG/ACT inhaler  Commonly known as:  VENTOLIN HFA  Inhale 2 puffs into the lungs every 6 (six) hours as needed for wheezing. PATIENT NEEDS TO CALL FOR AN APPOINTMENT     ALPRAZolam 0.5 MG tablet  Commonly known as:  XANAX  Take 0.5 mg by mouth 3 (three) times daily as needed. As needed     colesevelam 625 MG tablet  Commonly known as:  WELCHOL  Take 1,875 mg by mouth 2 (two) times daily with a meal.     fluticasone 50 MCG/ACT nasal spray  Commonly known as:  FLONASE  Place 2 sprays into the nose daily.     levothyroxine 112 MCG tablet  Commonly known as:  SYNTHROID, LEVOTHROID  Take 112 mcg by mouth daily.     metoprolol 50 MG tablet  Commonly known as:  LOPRESSOR  Take 50 mg by mouth daily.     mometasone-formoterol 100-5 MCG/ACT Aero  Commonly known as:  DULERA  Inhale 2 puffs into the lungs 2 (two) times daily.     oxyCODONE-acetaminophen 5-325 MG per tablet  Commonly known as:  PERCOCET/ROXICET  Take 1-2 tablets by mouth every 6 (six) hours as needed.     sodium chloride 0.65 % nasal spray  Commonly known as:  OCEAN  Place 1 spray into the nose as needed.         Follow-up Information   Follow up  with Valarie Merino, MD On 05/29/2013. (Your appointment is at 3:30pm, please arrive at least 30 minutes before your appointment to complete your check in paperwork.  If you are unable to arrive 30 minutes prior to your appointment time we may have to cancel or reschedule you.)    Specialty:  General Surgery   Contact information:   [address] [place] [address] [phone]       Follow up with REDMON,NOELLE, PA-C. (Please follow up with her regarding your blood pressure and asthma for a recheck upon discharge from the hospital)    Specialty:  Nurse Practitioner      Follow up with Valarie Merino, MD On 06/06/2013. (appt at 10:40, please arrive no later than 10:20 for check in)    Specialty:  General Surgery   Contact information:   [address] [place] [address] [phone]       Signed: Candiss Norse [facility] [phone]  05/21/2013, 1:07 PM

## 2013-05-29 ENCOUNTER — Ambulatory Visit (INDEPENDENT_AMBULATORY_CARE_PROVIDER_SITE_OTHER): Payer: 59

## 2013-05-29 DIAGNOSIS — Z9049 Acquired absence of other specified parts of digestive tract: Secondary | ICD-10-CM

## 2013-05-29 DIAGNOSIS — Z9889 Other specified postprocedural states: Secondary | ICD-10-CM

## 2013-05-29 NOTE — Progress Notes (Signed)
The pt came in for drain check.  She has been doing well.  The drainage has decreased the past couple days.  She has stopped keeping up with the amounts.  I removed the drain without difficulty and applied a dry gauze dressing.  I encouraged her to shower and keep area clean and dry.  I asked her to keep it covered today and she may shower tomorrow.  I asked her to just put a smaller band aid on it tomorrow.  The hole should close up in a couple days.  She will call with any problems.

## 2013-06-06 ENCOUNTER — Other Ambulatory Visit: Payer: Self-pay

## 2013-06-06 ENCOUNTER — Ambulatory Visit (INDEPENDENT_AMBULATORY_CARE_PROVIDER_SITE_OTHER): Payer: 59 | Admitting: Surgery

## 2013-06-06 ENCOUNTER — Encounter (INDEPENDENT_AMBULATORY_CARE_PROVIDER_SITE_OTHER): Payer: Self-pay | Admitting: Surgery

## 2013-06-06 DIAGNOSIS — Z9889 Other specified postprocedural states: Secondary | ICD-10-CM

## 2013-06-06 DIAGNOSIS — Z9049 Acquired absence of other specified parts of digestive tract: Secondary | ICD-10-CM | POA: Insufficient documentation

## 2013-06-06 DIAGNOSIS — Z1231 Encounter for screening mammogram for malignant neoplasm of breast: Secondary | ICD-10-CM

## 2013-06-06 NOTE — Patient Instructions (Signed)

## 2013-06-06 NOTE — Progress Notes (Signed)
Elizabeth Newman 62 y.o.  There is no weight on file to calculate BMI.  Patient Active Problem List   Diagnosis Date Noted  . Cholecystitis, acute 05/19/2013  . ALLERGIC RHINITIS 04/27/2010  . 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 Iodine Anaphylaxis    Past Surgical History  Procedure Laterality Date  . Back surgery  1993 and 2000  . Cholecystectomy N/A 05/20/2013    Procedure: LAPAROSCOPIC CHOLECYSTECTOMY WITH INTRAOPERATIVE CHOLANGIOGRAM;  Surgeon: Valarie Merino, MD;  Location: [facility];  Service: General;  Laterality: N/A;   REDMON,NOELLE, PA-C No diagnosis found.  Doing well.  Back at work.  Drain pulled last week.  Incisions bland.  Return prn Elizabeth B. Daphine Deutscher, MD, [facility] [phone] beeper [phone]  06/06/2013 11:46 AM

## 2013-06-10 ENCOUNTER — Encounter (INDEPENDENT_AMBULATORY_CARE_PROVIDER_SITE_OTHER): Payer: 59

## 2013-06-26 ENCOUNTER — Ambulatory Visit
Admission: RE | Admit: 2013-06-26 | Discharge: 2013-06-26 | Disposition: A | Discharge status: Home / Self Care | Payer: 59 | Source: Ambulatory Visit

## 2013-06-26 DIAGNOSIS — Z1231 Encounter for screening mammogram for malignant neoplasm of breast: Secondary | ICD-10-CM

## 2013-10-27 ENCOUNTER — Encounter: Payer: Self-pay | Admitting: General Surgery

## 2013-10-27 DIAGNOSIS — I10 Essential (primary) hypertension: Secondary | ICD-10-CM

## 2013-10-27 DIAGNOSIS — E7800 Pure hypercholesterolemia, unspecified: Secondary | ICD-10-CM

## 2013-11-25 ENCOUNTER — Ambulatory Visit (INDEPENDENT_AMBULATORY_CARE_PROVIDER_SITE_OTHER): Payer: 59 | Admitting: Adult Health

## 2013-11-25 ENCOUNTER — Encounter: Payer: Self-pay | Admitting: Adult Health

## 2013-11-25 VITALS — BP 154/76 | HR 83 | Temp 97.0°F | Ht 65.0 in | Wt 139.0 lb

## 2013-11-25 DIAGNOSIS — J449 Chronic obstructive pulmonary disease, unspecified: Secondary | ICD-10-CM

## 2013-11-25 MED ORDER — PREDNISONE 10 MG PO TABS: ORAL_TABLET | ORAL | Status: DC

## 2013-11-25 MED ORDER — AZITHROMYCIN 250 MG PO TABS: ORAL_TABLET | ORAL | Status: AC

## 2013-11-25 NOTE — Assessment & Plan Note (Signed)
Flare with bronchitis   Plan  Z-Pak take as directed. Mucinex DM twice daily as needed. For cough and congestion. Saline nasal rinses as needed. Claritin 10 mg daily as needed. For drainage  Prednisone taper. Over the next week. Please contact office for sooner follow up if symptoms do not improve or worsen or seek emergency care  follow up with. Dr. Craige CottaSood  And As needed

## 2013-11-25 NOTE — Progress Notes (Signed)
   Subjective:    Patient ID: Elizabeth Newman, female    DOB: 23-Feb-1951, 63 y.o.   MRN: [id number]  HPI 63 yo former smoker with chronic obstructive asthma, and rhinitis.   TESTS: PFT 04/27/10 >> FEV1 2.17(90%), FEV1% 68, TLC 5.48(107%), DLCO 81%, +BD response.   11/25/2013 Acute OV Patient presents for an acute office visit. Patient complains of VS pt here for head congestion w/ green/bloody drainage, PND, sore throat, prod cough with green mucus, wheezing, increased SOB, chest tightness x1 week.  pt denies f/c/s, hemoptysis, nausea, vomiting Has not taken any otc meds.    Review of Systems Constitutional:   No  weight loss, night sweats,  Fevers, chills, fatigue, or  lassitude.  HEENT:   No headaches,  Difficulty swallowing,  Tooth/dental problems, or  Sore throat,                No sneezing, itching, ear ache, nasal congestion, post nasal drip,   CV:  No chest pain,  Orthopnea, PND, swelling in lower extremities, anasarca, dizziness, palpitations, syncope.   GI  No heartburn, indigestion, abdominal pain, nausea, vomiting, diarrhea, change in bowel habits, loss of appetite, bloody stools.   Resp: No shortness of breath with exertion or at rest.  No excess mucus, no productive cough,  No non-productive cough,  No coughing up of blood.  No change in color of mucus.  No wheezing.  No chest wall deformity  Skin: no rash or lesions.  GU: no dysuria, change in color of urine, no urgency or frequency.  No flank pain, no hematuria   MS:  No joint pain or swelling.  No decreased range of motion.  No back pain.  Psych:  No change in mood or affect. No depression or anxiety.  No memory loss.         Objective:   Physical Exam GEN: A/Ox3; pleasant , NAD, well nourished   HEENT:  [place]/AT,  EACs-clear, TMs-wnl, NOSE-clear, THROAT-clear, no lesions, no postnasal drip or exudate noted.   NECK:  Supple w/ fair ROM; no JVD; normal carotid impulses w/o bruits; no thyromegaly or nodules  palpated; no lymphadenopathy.  RESP  Clear  P & A; w/o, wheezes/ rales/ or rhonchi.no accessory muscle use, no dullness to percussion  CARD:  RRR, no m/r/g  , no peripheral edema, pulses intact, no cyanosis or clubbing.  GI:   Soft & nt; nml bowel sounds; no organomegaly or masses detected.  Musco: Warm bil, no deformities or joint swelling noted.   Neuro: alert, no focal deficits noted.    Skin: Warm, no lesions or rashes         Assessment & Plan:

## 2013-11-25 NOTE — Patient Instructions (Signed)
Z-Pak take as directed. Mucinex DM twice daily as needed. For cough and congestion. Saline nasal rinses as needed. Claritin 10 mg daily as needed. For drainage  Prednisone taper. Over the next week. Please contact office for sooner follow up if symptoms do not improve or worsen or seek emergency care  follow up with. Dr. Craige CottaSood  And As needed

## 2013-12-03 ENCOUNTER — Other Ambulatory Visit: Payer: Self-pay | Admitting: *Deleted

## 2013-12-03 MED ORDER — ALBUTEROL SULFATE HFA 108 (90 BASE) MCG/ACT IN AERS: 2.0000 | INHALATION_SPRAY | Freq: Four times a day (QID) | RESPIRATORY_TRACT | Status: DC | PRN

## 2013-12-03 MED ORDER — MOMETASONE FURO-FORMOTEROL FUM 100-5 MCG/ACT IN AERO: 2.0000 | INHALATION_SPRAY | Freq: Two times a day (BID) | RESPIRATORY_TRACT | Status: DC

## 2014-04-17 ENCOUNTER — Other Ambulatory Visit: Payer: Self-pay

## 2014-05-02 ENCOUNTER — Emergency Department (HOSPITAL_COMMUNITY)
Admission: EM | Admit: 2014-05-02 | Discharge: 2014-05-02 | Disposition: A | Discharge status: Home / Self Care | Payer: 59 | Attending: Emergency Medicine | Admitting: Emergency Medicine

## 2014-05-02 ENCOUNTER — Encounter (HOSPITAL_COMMUNITY): Payer: Self-pay | Admitting: Emergency Medicine

## 2014-05-02 ENCOUNTER — Emergency Department (HOSPITAL_COMMUNITY): Payer: 59

## 2014-05-02 DIAGNOSIS — J45901 Unspecified asthma with (acute) exacerbation: Secondary | ICD-10-CM | POA: Diagnosis not present

## 2014-05-02 DIAGNOSIS — F329 Major depressive disorder, single episode, unspecified: Secondary | ICD-10-CM | POA: Diagnosis not present

## 2014-05-02 DIAGNOSIS — M5090 Cervical disc disorder, unspecified, unspecified cervical region: Secondary | ICD-10-CM

## 2014-05-02 DIAGNOSIS — E039 Hypothyroidism, unspecified: Secondary | ICD-10-CM | POA: Insufficient documentation

## 2014-05-02 DIAGNOSIS — Z87891 Personal history of nicotine dependence: Secondary | ICD-10-CM | POA: Diagnosis not present

## 2014-05-02 DIAGNOSIS — M810 Age-related osteoporosis without current pathological fracture: Secondary | ICD-10-CM | POA: Diagnosis not present

## 2014-05-02 DIAGNOSIS — E785 Hyperlipidemia, unspecified: Secondary | ICD-10-CM | POA: Diagnosis not present

## 2014-05-02 DIAGNOSIS — F411 Generalized anxiety disorder: Secondary | ICD-10-CM | POA: Insufficient documentation

## 2014-05-02 DIAGNOSIS — M542 Cervicalgia: Secondary | ICD-10-CM | POA: Insufficient documentation

## 2014-05-02 DIAGNOSIS — F3289 Other specified depressive episodes: Secondary | ICD-10-CM | POA: Diagnosis not present

## 2014-05-02 DIAGNOSIS — J441 Chronic obstructive pulmonary disease with (acute) exacerbation: Secondary | ICD-10-CM | POA: Diagnosis not present

## 2014-05-02 DIAGNOSIS — IMO0002 Reserved for concepts with insufficient information to code with codable children: Secondary | ICD-10-CM | POA: Insufficient documentation

## 2014-05-02 DIAGNOSIS — Z79899 Other long term (current) drug therapy: Secondary | ICD-10-CM | POA: Diagnosis not present

## 2014-05-02 DIAGNOSIS — I10 Essential (primary) hypertension: Secondary | ICD-10-CM | POA: Diagnosis not present

## 2014-05-02 DIAGNOSIS — J309 Allergic rhinitis, unspecified: Secondary | ICD-10-CM | POA: Insufficient documentation

## 2014-05-02 LAB — COMPREHENSIVE METABOLIC PANEL
ALK PHOS: 95 U/L (ref 39–117)
ALT: 27 U/L (ref 0–35)
ANION GAP: 18 — AB (ref 5–15)
AST: 34 U/L (ref 0–37)
Albumin: 3.4 g/dL — ABNORMAL LOW (ref 3.5–5.2)
BUN: 13 mg/dL (ref 6–23)
CO2: 23 meq/L (ref 19–32)
Calcium: 9.6 mg/dL (ref 8.4–10.5)
Chloride: 99 mEq/L (ref 96–112)
Creatinine, Ser: 0.53 mg/dL (ref 0.50–1.10)
GFR calc Af Amer: >90 mL/min (ref >90)
Glucose, Bld: 121 mg/dL — ABNORMAL HIGH (ref 70–99)
POTASSIUM: 4.8 meq/L (ref 3.7–5.3)
SODIUM: 140 meq/L (ref 137–147)
Total Protein: 7.5 g/dL (ref 6.0–8.3)

## 2014-05-02 LAB — CBC WITH DIFFERENTIAL/PLATELET
Basophils Absolute: 0 10*3/uL (ref 0.0–0.1)
Basophils Relative: 0 % (ref 0–1)
EOS PCT: 4 % (ref 0–5)
Eosinophils Absolute: 0.4 10*3/uL (ref 0.0–0.7)
HEMATOCRIT: 44.9 % (ref 36.0–46.0)
HEMOGLOBIN: 14.7 g/dL (ref 12.0–15.0)
LYMPHS PCT: 32 % (ref 12–46)
Lymphs Abs: 3.2 10*3/uL (ref 0.7–4.0)
MCH: 31.5 pg (ref 26.0–34.0)
MCHC: 32.7 g/dL (ref 30.0–36.0)
MCV: 96.4 fL (ref 78.0–100.0)
MONO ABS: 0.6 10*3/uL (ref 0.1–1.0)
Monocytes Relative: 6 % (ref 3–12)
NEUTROS ABS: 5.9 10*3/uL (ref 1.7–7.7)
Neutrophils Relative %: 58 % (ref 43–77)
Platelets: 252 10*3/uL (ref 150–400)
RBC: 4.66 MIL/uL (ref 3.87–5.11)
RDW: 13.2 % (ref 11.5–15.5)
WBC: 10.1 10*3/uL (ref 4.0–10.5)

## 2014-05-02 LAB — I-STAT TROPONIN, ED: Troponin i, poc: 0.01 ng/mL (ref 0.00–0.08)

## 2014-05-02 MED ORDER — CYCLOBENZAPRINE HCL 10 MG PO TABS: 10.0000 mg | ORAL_TABLET | Freq: Two times a day (BID) | ORAL | Status: DC | PRN

## 2014-05-02 MED ORDER — MORPHINE SULFATE 4 MG/ML IJ SOLN
4.0000 mg | Freq: Once | INTRAMUSCULAR | Status: AC
  Administered 2014-05-02: 4 mg via INTRAVENOUS
  Filled 2014-05-02: qty 1

## 2014-05-02 MED ORDER — ONDANSETRON HCL 4 MG/2ML IJ SOLN
4.0000 mg | Freq: Once | INTRAMUSCULAR | Status: AC
  Administered 2014-05-02: 4 mg via INTRAVENOUS
  Filled 2014-05-02: qty 2

## 2014-05-02 MED ORDER — DIAZEPAM 5 MG/ML IJ SOLN
5.0000 mg | Freq: Once | INTRAMUSCULAR | Status: AC
  Administered 2014-05-02: 5 mg via INTRAVENOUS
  Filled 2014-05-02: qty 2

## 2014-05-02 MED ORDER — IBUPROFEN 800 MG PO TABS: 800.0000 mg | ORAL_TABLET | Freq: Three times a day (TID) | ORAL | Status: DC

## 2014-05-02 MED ORDER — FENTANYL CITRATE 0.05 MG/ML IJ SOLN
50.0000 ug | Freq: Once | INTRAMUSCULAR | Status: AC
  Administered 2014-05-02: 50 ug via INTRAVENOUS
  Filled 2014-05-02: qty 2

## 2014-05-02 MED ORDER — HYDROCODONE-ACETAMINOPHEN 5-325 MG PO TABS
1.0000 | ORAL_TABLET | Freq: Four times a day (QID) | ORAL | Status: DC | PRN
Start: 2014-05-02 — End: 2014-09-21

## 2014-05-02 NOTE — ED Provider Notes (Signed)
CSN: [id number]     Arrival date & time 05/02/14  1928 History   First MD Initiated Contact with Patient 05/02/14 1942     Chief Complaint  Patient presents with  . Neck Pain  . Shoulder Pain     (Consider location/radiation/quality/duration/timing/severity/associated sxs/prior Treatment) HPI Elizabeth Newman is a 63 year old female with past medical history of hypertension, COPD, osteoporosis who presents with 3 days of neck pain and headache. Patient states her neck pain began acutely on Thursday, and has since not improved. Patient describes her pain as a "sharp pain", which radiates down her shoulders to her elbows bilaterally. Patient states her pain is constant, it is aggravated by movement, is a 10 out of 10, and she denies any associated numbness, weakness, dizziness, blurred vision, fever, neck stiffness, nausea, vomiting, recent illness, neck injury, IV drug use. Patient states she has no history of neck pain, and does have a history of skin cancer. Patient states she has tried using heat, ice and hot, ibuprofen to help her pain with no relief.  Past Medical History  Diagnosis Date  . Hypothyroidism   . HTN (hypertension)   . Chronic obstructive asthma   . Allergic rhinitis   . Hyperlipidemia     high cholesterol. Elevated LFT's on statin  . Osteoporosis     BMD 2009  . Anxiety and depression   . Vitamin D deficiency   . History of nuclear stress test 2012    No Ischemia   Past Surgical History  Procedure Laterality Date  . Back surgery  1993 and 2000  . Cholecystectomy N/A 05/20/2013    Procedure: LAPAROSCOPIC CHOLECYSTECTOMY WITH INTRAOPERATIVE CHOLANGIOGRAM;  Surgeon: Valarie Merino, MD;  Location: [facility];  Service: General;  Laterality: N/A;   Family History  Problem Relation Age of Onset  . Asthma Mother   . Asthma Paternal Grandfather   . Emphysema Mother   . Hypertension Father   . Bone cancer Maternal Grandfather   . Heart disease Maternal Grandmother   .  Heart disease Paternal Grandmother   . Allergies Mother    History  Substance Use Topics  . Smoking status: Former Smoker -- 0.30 packs/day for 30 years    Types: Cigarettes    Quit date: 04/05/2011  . Smokeless tobacco: Not on file  . Alcohol Use: No   OB History   Grav Para Term Preterm Abortions TAB SAB Ect Mult Living                 Review of Systems  Constitutional: Negative for fever.  HENT: Negative for drooling, sore throat, tinnitus, trouble swallowing and voice change.   Eyes: Negative for visual disturbance.  Respiratory: Negative for shortness of breath.   Cardiovascular: Negative for chest pain.  Gastrointestinal: Negative for nausea, vomiting and abdominal pain.  Genitourinary: Negative for dysuria.  Musculoskeletal: Positive for neck pain. Negative for back pain and neck stiffness.  Skin: Negative for pallor and rash.  Neurological: Positive for headaches. Negative for dizziness, syncope, weakness, light-headedness and numbness.  Psychiatric/Behavioral: Negative.       Allergies  Iodine and Shellfish allergy  Home Medications   Prior to Admission medications   Medication Sig Start Date End Date Taking? Authorizing Provider  ALPRAZolam Prudy Feeler) 0.5 MG tablet Take 0.5 mg by mouth 3 (three) times daily as needed for anxiety.  10/04/11  Yes Historical Provider, MD  beclomethasone (QVAR) 80 MCG/ACT inhaler Inhale 2 puffs into the lungs 2 (two) times daily.  Yes Historical Provider, MD  colesevelam (WELCHOL) 625 MG tablet Take 1,875 mg by mouth 2 (two) times daily with a meal.   Yes Historical Provider, MD  levothyroxine (SYNTHROID, LEVOTHROID) 75 MCG tablet Take 75 mcg by mouth daily before breakfast.   Yes Historical Provider, MD  metoprolol (LOPRESSOR) 50 MG tablet Take 50 mg by mouth daily.    Yes Historical Provider, MD  omega-3 acid ethyl esters (LOVAZA) 1 G capsule Take 1 g by mouth daily.   Yes Historical Provider, MD  sertraline (ZOLOFT) 50 MG tablet Take  50 mg by mouth daily.   Yes Historical Provider, MD  simvastatin (ZOCOR) 40 MG tablet Take 40 mg by mouth daily.   Yes Historical Provider, MD  albuterol (VENTOLIN HFA) 108 (90 BASE) MCG/ACT inhaler Inhale 2 puffs into the lungs every 6 (six) hours as needed for wheezing. PATIENT NEEDS TO CALL FOR AN APPOINTMENT 12/03/13 03/22/15  Coralyn Helling, MD  cyclobenzaprine (FLEXERIL) 10 MG tablet Take 1 tablet (10 mg total) by mouth 2 (two) times daily as needed for muscle spasms. 05/02/14   Monte Fantasia, PA-C  HYDROcodone-acetaminophen (NORCO/VICODIN) 5-325 MG per tablet Take 1-2 tablets by mouth every 6 (six) hours as needed for moderate pain or severe pain. 05/02/14   Monte Fantasia, PA-C  ibuprofen (ADVIL,MOTRIN) 800 MG tablet Take 1 tablet (800 mg total) by mouth 3 (three) times daily. 05/02/14   Monte Fantasia, PA-C  zoledronic acid (RECLAST) 5 MG/100ML SOLN injection Inject 5 mg into the vein once. Once a year    Historical Provider, MD   BP 165/72  Pulse 91  Temp(Src) 98.7 F (37.1 C) (Oral)  Resp 19  Ht  (1.651 m)  Wt 138 lb (62.596 kg)  BMI 22.96 kg/m2  SpO2 93% Physical Exam  Nursing note and vitals reviewed. Constitutional: She is oriented to person, place, and time. She appears well-developed and well-nourished. No distress.  HENT:  Head: Normocephalic and atraumatic.  Mouth/Throat: Oropharynx is clear and moist. No oropharyngeal exudate.  Eyes: Conjunctivae and EOM are normal. Pupils are equal, round, and reactive to light. Right eye exhibits no discharge. Left eye exhibits no discharge. No scleral icterus.  Neck: Normal range of motion. Spinous process tenderness present. No muscular tenderness present. No rigidity.  Cardiovascular: Normal rate, regular rhythm and normal heart sounds.   No murmur heard. Pulmonary/Chest: Effort normal and breath sounds normal. No respiratory distress.  Abdominal: Soft. There is no tenderness.  Musculoskeletal: Normal range of motion. She exhibits no  edema and no tenderness.  Pain/tenderness noted to C1-C3 region.   Neurological: She is alert and oriented to person, place, and time. She has normal strength. No cranial nerve deficit or sensory deficit. She displays a negative Romberg sign. Coordination and gait normal. GCS eye subscore is 4. GCS verbal subscore is 5. GCS motor subscore is 6.  Reflex Scores:      Tricep reflexes are 2+ on the right side and 2+ on the left side.      Bicep reflexes are 2+ on the right side and 2+ on the left side.      Brachioradialis reflexes are 2+ on the right side and 2+ on the left side. Skin: Skin is warm and dry. No rash noted. She is not diaphoretic.  Psychiatric: She has a normal mood and affect.    ED Course  Procedures (including critical care time) Labs Review Labs Reviewed  COMPREHENSIVE METABOLIC PANEL - Abnormal; Notable for the  following:    Glucose, Bld 121 (*)    Albumin 3.4 (*)    Total Bilirubin <0.2 (*)    Anion gap 18 (*)    All other components within normal limits  CBC WITH DIFFERENTIAL  CBC WITH DIFFERENTIAL  I-STAT CHEM 8, ED  I-STAT TROPOININ, ED  I-STAT TROPOININ, ED    Imaging Review Dg Cervical Spine Complete  05/02/2014   CLINICAL DATA:  Neck pain radiating to shoulders.  EXAM: CERVICAL SPINE  4+ VIEWS  COMPARISON:  None.  FINDINGS: There is no evidence of cervical spine fracture or prevertebral soft tissue swelling.  Moderate to severe degenerative disc disease seen from levels of C3-C7. Mild cervical kyphosis noted.  Advanced bilateral facet DJD also seen at all cervical levels, left side greater than right. Mild anterolisthesis is seen at C7-T1 measuring 3 mm, which is attributable to facet DJD at this level. Bilateral neural foraminal stenosis seen at majority of cervical levels.  IMPRESSION: No evidence of acute cervical spine fracture.  Degenerative spondylosis, as described above.   Electronically Signed   By: Myles Rosenthal M.D.   On: 05/02/2014 22:00     EKG  Interpretation   Date/Time:  Saturday May 02 2014 21:02:06 EDT Ventricular Rate:  100 PR Interval:  149 QRS Duration: 97 QT Interval:  384 QTC Calculation: 495 R Axis:   60 Text Interpretation:  Sinus tachycardia Probable left ventricular  hypertrophy Anterior Q waves, possibly due to LVH Baseline wander in  lead(s) II III aVF V1 V2 V3 V4 V5 V6 No significant change since last  tracing Confirmed by Karma Ganja  MD, MARTHA [phone]) on 05/02/2014 10:19:35 PM      MDM   Final diagnoses:  Cervical neck pain with evidence of disc disease    63 year old female with past medical history of hypertension, osteoporosis presenting with 3 days of new neck pain which radiates to her ventral arms bilaterally. Patient denies having any history of neck pain, states she was at work sitting at a computer when she started noticing the pain. Patient states she has some chronic back pain which she has been seen for in the past. Patient's pain is in her C1-C3 region, and radiates into her arms bilaterally to her elbows. Patient denies numbness, weakness, tingling, paresthesias, urinary/bowel incontinence/retention, IV drug use, fever, recent illness, nausea, vomiting, fever. Patient states she has had skin cancer in her hand. Workup to include x-rays of cervical spine and pain management.    Cervical x-rays returned with impression of degenerative spondylosis. Neuro exam remains benign. DTRs 2+ bilaterally in upper extremities. Motor strength 5 out of 5 in all major muscle groups of upper extremities. Distal sensation intact.  No neurological deficits and normal neuro exam.  Patient has normal range of motion, however states it is painful. No loss of bowel or bladder control.  No concern for cauda equina.  No fever, night sweats, weight loss, IVDU.  Patient has history of skin cancer, however due to to the fact that patient has diffuse degenerative disease in her neck along with impression of degenerative  spondylosis, and no obvious neurologic deficits, spinal tumor not as likely at this time. RICE protocol and pain medicine indicated and discussed with patient. We will discharge patient with prescription for ibuprofen and muscle relaxer. I strongly encouraged patient to follow up with her primary care physician and advised her that she will most likely need an outpatient MRI, however does not need one emergently tonight. Patient states she  has been seen by orthopedics for her lower back pain in the past, and she would like to go back to them for this. I told her that this was a viable option and encouraged her to followup with them. Patient was agreeable to this plan. I cautioned patient to return to the ER should she develop any weakness, numbness, tingling, or should her symptoms persist, worsen or should she have a questions or concerns.    Filed Vitals:   05/02/14 2258  BP: 165/72  Pulse: 91  Temp: 98.7 F (37.1 C)  Resp: 19    Signed,  Ladona Mow, PA-C 12:57 AM   This patient seen and discussed with Dr. Jerelyn Scott, M.D.  Monte Fantasia, PA-C 05/03/14 0111

## 2014-05-02 NOTE — ED Notes (Signed)
PA at bedside.

## 2014-05-02 NOTE — ED Notes (Signed)
This pain in the left arm started Thursday and has gradually gotten worse. Patient stated it is radiating up her neck to her head

## 2014-05-02 NOTE — ED Notes (Signed)
Pt states she is having pain in her neck and shoulders  Pt states the pain started on Thursday and has progressively gotten worse  Pt denies injury  States is having sharp pains going from her neck up into her head  Pt states she is having muscle spasms  Pt states the left side is worse than the right

## 2014-05-02 NOTE — ED Notes (Signed)
Patient transported to X-ray 

## 2014-05-02 NOTE — Discharge Instructions (Signed)
Take Motrin 800 mg up to 3 times a day for inflammation and pain. Use hydrocodone 1-2 pills every 4-6 hours as needed for pain. Use Flexeril up to twice a day as needed for muscle spasms. Followup with orthopedics.    Degenerative Disk Disease Degenerative disk disease is a condition caused by the changes that occur in the cushions of the backbone (spinal disks) as you grow older. Spinal disks are soft and compressible disks located between the bones of the spine (vertebrae). They act like shock absorbers. Degenerative disk disease can affect the whole spine. However, the neck and lower back are most commonly affected. Many changes can occur in the spinal disks with aging, such as:  The spinal disks may dry and shrink.  Small tears may occur in the tough, outer covering of the disk (annulus).  The disk space may become smaller due to loss of water.  Abnormal growths in the bone (spurs) may occur. This can put pressure on the nerve roots exiting the spinal canal, causing pain.  The spinal canal may become narrowed. CAUSES  Degenerative disk disease is a condition caused by the changes that occur in the spinal disks with aging. The exact cause is not known, but there is a genetic basis for many patients. Degenerative changes can occur due to loss of fluid in the disk. This makes the disk thinner and reduces the space between the backbones. Small cracks can develop in the outer layer of the disk. This can lead to the breakdown of the disk. You are more likely to get degenerative disk disease if you are overweight. Smoking cigarettes and doing heavy work such as weightlifting can also increase your risk of this condition. Degenerative changes can start after a sudden injury. Growth of bone spurs can compress the nerve roots and cause pain.  SYMPTOMS  The symptoms vary from person to person. Some people may have no pain, while others have severe pain. The pain may be so severe that it can limit your  activities. The location of the pain depends on the part of your backbone that is affected. You will have neck or arm pain if a disk in the neck area is affected. You will have pain in your back, buttocks, or legs if a disk in the lower back is affected. The pain becomes worse while bending, reaching up, or with twisting movements. The pain may start gradually and then get worse as time passes. It may also start after a major or minor injury. You may feel numbness or tingling in the arms or legs.  DIAGNOSIS  Your caregiver will ask you about your symptoms and about activities or habits that may cause the pain. He or she may also ask about any injuries, diseases, or treatments you have had earlier. Your caregiver will examine you to check for the range of movement that is possible in the affected area, to check for strength in your extremities, and to check for sensation in the areas of the arms and legs supplied by different nerve roots. An X-ray of the spine may be taken. Your caregiver may suggest other imaging tests, such as magnetic resonance imaging (MRI), if needed.  TREATMENT  Treatment includes rest, modifying your activities, and applying ice and heat. Your caregiver may prescribe medicines to reduce your pain and may ask you to do some exercises to strengthen your back. In some cases, you may need surgery. You and your caregiver will decide on the treatment that is best  for you. HOME CARE INSTRUCTIONS   Follow proper lifting and walking techniques as advised by your caregiver.  Maintain good posture.  Exercise regularly as advised.  Perform relaxation exercises.  Change your sitting, standing, and sleeping habits as advised. Change positions frequently.  Lose weight as advised.  Stop smoking if you smoke.  Wear supportive footwear. SEEK MEDICAL CARE IF:  Your pain does not go away within 1 to 4 weeks. SEEK IMMEDIATE MEDICAL CARE IF:   Your pain is severe.  You notice weakness in  your arms, hands, or legs.  You begin to lose control of your bladder or bowel movements. MAKE SURE YOU:   Understand these instructions.  Will watch your condition.  Will get help right away if you are not doing well or get worse. Document Released: 06/11/2007 Document Revised: 11/06/2011 Document Reviewed: 12/16/2013 [facility] Patient Information 2015 [place], [place]. This information is not intended to replace advice given to you by your health care provider. Make sure you discuss any questions you have with your health care provider.

## 2014-05-03 NOTE — ED Provider Notes (Signed)
Medical screening examination/treatment/procedure(s) were performed by non-physician practitioner and as supervising physician I was immediately available for consultation/collaboration.   EKG Interpretation   Date/Time:  Saturday May 02 2014 21:02:06 EDT Ventricular Rate:  100 PR Interval:  149 QRS Duration: 97 QT Interval:  384 QTC Calculation: 495 R Axis:   60 Text Interpretation:  Sinus tachycardia Probable left ventricular  hypertrophy Anterior Q waves, possibly due to LVH Baseline wander in  lead(s) II III aVF V1 V2 V3 V4 V5 V6 No significant change since last  tracing Confirmed by Karma Ganja  MD, Kache Mcclurg [id number]) on 05/02/2014 10:19:35 PM       Ethelda Chick, MD 05/03/14 2314

## 2014-06-21 ENCOUNTER — Other Ambulatory Visit: Payer: Self-pay | Admitting: Pulmonary Disease

## 2014-06-23 ENCOUNTER — Other Ambulatory Visit: Payer: Self-pay | Admitting: Physical Medicine and Rehabilitation

## 2014-06-23 DIAGNOSIS — M542 Cervicalgia: Secondary | ICD-10-CM

## 2014-06-29 ENCOUNTER — Ambulatory Visit
Admission: RE | Admit: 2014-06-29 | Discharge: 2014-06-29 | Disposition: A | Discharge status: Home / Self Care | Payer: 59 | Source: Ambulatory Visit | Attending: Physical Medicine and Rehabilitation | Admitting: Physical Medicine and Rehabilitation

## 2014-06-29 DIAGNOSIS — M542 Cervicalgia: Secondary | ICD-10-CM

## 2014-06-29 MED ORDER — TRIAMCINOLONE ACETONIDE 40 MG/ML IJ SUSP (RADIOLOGY)
60.0000 mg | Freq: Once | INTRAMUSCULAR | Status: AC
  Administered 2014-06-29: 60 mg via EPIDURAL

## 2014-06-29 MED ORDER — IOHEXOL 300 MG/ML  SOLN
1.0000 mL | Freq: Once | INTRAMUSCULAR | Status: AC | PRN
  Administered 2014-06-29: 1 mL via EPIDURAL

## 2014-06-29 NOTE — Discharge Instructions (Signed)

## 2014-07-30 ENCOUNTER — Other Ambulatory Visit: Payer: Self-pay

## 2014-07-30 DIAGNOSIS — Z1231 Encounter for screening mammogram for malignant neoplasm of breast: Secondary | ICD-10-CM

## 2014-08-18 ENCOUNTER — Telehealth: Payer: Self-pay | Admitting: Pulmonary Disease

## 2014-08-18 NOTE — Telephone Encounter (Signed)
lmtcb for pt.  

## 2014-08-19 NOTE — Telephone Encounter (Signed)
lmtcb for pt.  

## 2014-08-19 NOTE — Telephone Encounter (Signed)
Patient is returning call.  [phone]

## 2014-08-20 NOTE — Telephone Encounter (Signed)
lmtcb x3 

## 2014-08-24 ENCOUNTER — Ambulatory Visit
Admission: RE | Admit: 2014-08-24 | Discharge: 2014-08-24 | Disposition: A | Discharge status: Home / Self Care | Payer: 59 | Source: Ambulatory Visit

## 2014-08-24 DIAGNOSIS — Z1231 Encounter for screening mammogram for malignant neoplasm of breast: Secondary | ICD-10-CM

## 2014-08-24 NOTE — Telephone Encounter (Signed)
lmomtcb for pt 

## 2014-08-25 MED ORDER — ALBUTEROL SULFATE HFA 108 (90 BASE) MCG/ACT IN AERS: 2.0000 | INHALATION_SPRAY | Freq: Four times a day (QID) | RESPIRATORY_TRACT | Status: DC | PRN

## 2014-08-25 NOTE — Telephone Encounter (Signed)
Called, spoke with pt -  She is requesting ventolin hfa rx to [facility]. Last OV with Dr. Craige CottaSood 12/16/2012. Was seen by TP on 10/2013 Pt does have a pending appt with Dr. Craige CottaSood on Sep 21, 2014. Rx sent to pharm and pt asked to keep pending appt with VS for additional rxs.  She verbalized understanding, is in agreement with plan, and voiced no further questions or concerns at this time.

## 2014-09-21 ENCOUNTER — Other Ambulatory Visit (INDEPENDENT_AMBULATORY_CARE_PROVIDER_SITE_OTHER): Payer: 59

## 2014-09-21 ENCOUNTER — Ambulatory Visit (INDEPENDENT_AMBULATORY_CARE_PROVIDER_SITE_OTHER): Payer: 59 | Admitting: Pulmonary Disease

## 2014-09-21 ENCOUNTER — Ambulatory Visit (INDEPENDENT_AMBULATORY_CARE_PROVIDER_SITE_OTHER)
Admission: RE | Admit: 2014-09-21 | Discharge: 2014-09-21 | Disposition: A | Discharge status: Home / Self Care | Payer: 59 | Source: Ambulatory Visit | Attending: Pulmonary Disease | Admitting: Pulmonary Disease

## 2014-09-21 VITALS — BP 184/82 | HR 92

## 2014-09-21 DIAGNOSIS — Z87891 Personal history of nicotine dependence: Secondary | ICD-10-CM

## 2014-09-21 DIAGNOSIS — J441 Chronic obstructive pulmonary disease with (acute) exacerbation: Secondary | ICD-10-CM

## 2014-09-21 DIAGNOSIS — J449 Chronic obstructive pulmonary disease, unspecified: Secondary | ICD-10-CM

## 2014-09-21 DIAGNOSIS — J3089 Other allergic rhinitis: Secondary | ICD-10-CM

## 2014-09-21 DIAGNOSIS — J4489 Other specified chronic obstructive pulmonary disease: Secondary | ICD-10-CM

## 2014-09-21 LAB — CBC WITH DIFFERENTIAL/PLATELET
BASOS ABS: 0.1 10*3/uL (ref 0.0–0.1)
Basophils Relative: 1.1 % (ref 0.0–3.0)
EOS ABS: 0.3 10*3/uL (ref 0.0–0.7)
Eosinophils Relative: 3.2 % (ref 0.0–5.0)
HEMATOCRIT: 43.4 % (ref 36.0–46.0)
Hemoglobin: 14.6 g/dL (ref 12.0–15.0)
Lymphocytes Relative: 26.3 % (ref 12.0–46.0)
Lymphs Abs: 2.6 10*3/uL (ref 0.7–4.0)
MCHC: 33.7 g/dL (ref 30.0–36.0)
MCV: 92.1 fl (ref 78.0–100.0)
MONOS PCT: 6.8 % (ref 3.0–12.0)
Monocytes Absolute: 0.7 10*3/uL (ref 0.1–1.0)
NEUTROS PCT: 62.6 % (ref 43.0–77.0)
Neutro Abs: 6.2 10*3/uL (ref 1.4–7.7)
PLATELETS: 392 10*3/uL (ref 150.0–400.0)
RBC: 4.71 Mil/uL (ref 3.87–5.11)
RDW: 13.5 % (ref 11.5–15.5)
WBC: 10 10*3/uL (ref 4.0–10.5)

## 2014-09-21 MED ORDER — MOMETASONE FURO-FORMOTEROL FUM 100-5 MCG/ACT IN AERO: 2.0000 | INHALATION_SPRAY | Freq: Two times a day (BID) | RESPIRATORY_TRACT | Status: DC

## 2014-09-21 MED ORDER — AZITHROMYCIN 250 MG PO TABS: ORAL_TABLET | ORAL | Status: DC

## 2014-09-21 NOTE — Patient Instructions (Signed)
Zithromax 250 mg pill >> 2 pills on day 1, then 1 pill daily for next 4 days Stop Qvar Dulera two puffs twice per day >> rinse mouth after each use Lab tests, and chest xray today Follow up in 4 weeks with Dr. Craige CottaSood or Tammy Parrett

## 2014-09-21 NOTE — Progress Notes (Signed)
Chief Complaint  Patient presents with  . Follow-up    Pt c/o increased mucus production, cough, wheezing and SOB. Pt states that she feels something is in her house causing her breathing issues. Pt reports seeing mold in house in December 2015.     History of Present Illness: Elizabeth Newman is a 64 y.o. female former smoker with chronic obstructive asthma, and rhinitis.   She has noticed her breathing getting worse for the past few months.  She thinks she has mold in her house.  She gets a cough and burning feeling in her bronchial tubes.  She gets occasional wheezing, and will cough up green to brown sputum.  She denies fever.  She was given prednisone, Abx, and neb treatment at Urgent care in November >> these helped some.  She has been using Qvar twice per day, and albuterol intermittently >> these help.  She was on dulera, and this helped >> she is not sure why this was changed.  TESTS: PFT 04/27/10 >> FEV1 2.17(90%), FEV1% 68, TLC 5.48(107%), DLCO 81%, +BD response.  PMHx >> HTN, HLD, Hypothyroidism, Depression, Anxiety  PSHx, Medications, Allergies, Fhx, Shx reviewed.   Physical Exam: Blood pressure 184/82, pulse 92, SpO2 93 %  General - No distress ENT - No sinus tenderness, no oral exudate, no LAN Cardiac - s1s2 regular, no murmur Chest - decreased breath sounds, faint wheeze, no rales Back - No focal tenderness Abd - Soft, non-tender Ext - No edema Neuro - Normal strength Skin - No rashes Psych - normal mood, and behavior   Assessment/Plan:  Acute asthmatic bronchitis. Plan: - will give her course of zithromax - don't think she needs prednisone at present - will check CXR today  COPD with asthma. I am concerned some of her symptoms are related to allergies. Plan: - will change her from Qvar to dulera - she can continue prn albuterol - will need f/u PFT's when her breathing is more stable - will check RAST with IgE and CBC with differential  Upper  airway cough syndrome with allergic rhinitis. Plan: - she can use OTC anti-histamines as needed   Elizabeth HellingVineet Michal Callicott, MD [place] [facility] Pager:  [phone]

## 2014-09-22 ENCOUNTER — Encounter: Payer: Self-pay | Admitting: Pulmonary Disease

## 2014-09-22 LAB — ALLERGY FULL PROFILE
Alternaria Alternata: <0.1 kU/L
Box Elder IgE: <0.1 kU/L
CANDIDA ALBICANS: 1.65 kU/L — AB
Cat Dander: <0.1 kU/L
Curvularia lunata: <0.1 kU/L
Dog Dander: <0.1 kU/L
Fescue: <0.1 kU/L
G009 Red Top: <0.1 kU/L
House Dust Hollister: <0.1 kU/L
IgE (Immunoglobulin E), Serum: 579 kU/L — ABNORMAL HIGH (ref <115)
Lamb's Quarters: <0.1 kU/L
Oak: <0.1 kU/L
Stemphylium Botryosum: <0.1 kU/L

## 2014-09-25 ENCOUNTER — Telehealth: Payer: Self-pay | Admitting: Pulmonary Disease

## 2014-09-25 NOTE — Telephone Encounter (Signed)
Patient notified.  No questions or concerns at this time. Nothing further needed.   

## 2014-09-25 NOTE — Telephone Encounter (Signed)
Dg Chest 2 View  09/21/2014   CLINICAL DATA:  64 year old with burning sensation in the chest and cough. Shortness of breath for 2 months.  EXAM: CHEST  2 VIEW  COMPARISON:  05/19/2013  FINDINGS: The basilar lung markings are prominent but appear to be chronic. There is no focal airspace disease or pulmonary edema. Calcifications at the aortic arch. The trachea is midline. Heart size is normal.  IMPRESSION: No active cardiopulmonary disease.   Electronically Signed   By: Richarda OverlieAdam  Henn M.D.   On: 09/21/2014 15:45   RAST 09/21/14 >> react to candida albicans; IgE 579  Will have my nurse inform pt that chest xray was normal.  Her allergy test showed reaction to certain type of yeast.  She should continue dulera, OTC anti histamines, and prn albuterol.

## 2014-10-19 ENCOUNTER — Encounter: Payer: Self-pay | Admitting: Adult Health

## 2014-10-19 ENCOUNTER — Ambulatory Visit (INDEPENDENT_AMBULATORY_CARE_PROVIDER_SITE_OTHER): Payer: 59 | Admitting: Adult Health

## 2014-10-19 VITALS — BP 160/80 | HR 94 | Temp 98.1°F | Wt 140.0 lb

## 2014-10-19 DIAGNOSIS — J449 Chronic obstructive pulmonary disease, unspecified: Secondary | ICD-10-CM

## 2014-10-19 MED ORDER — MOMETASONE FURO-FORMOTEROL FUM 100-5 MCG/ACT IN AERO: 2.0000 | INHALATION_SPRAY | Freq: Two times a day (BID) | RESPIRATORY_TRACT | Status: DC

## 2014-10-19 NOTE — Assessment & Plan Note (Signed)
Recent flare , now resolving Needs PFTs on return  Plan  Begin Dulera 2 puffs Twice daily  , will need to pick up at pharmacy.  Follow up with family doctor as your blood pressure is elevated.  Follow up Dr. Craige CottaSood  In 2-3 months with PFT

## 2014-10-19 NOTE — Progress Notes (Signed)
   Subjective:    Patient ID: Elizabeth Newman, female    DOB: 1951/06/28, 64 y.o.   MRN: [id number]  HPI Elizabeth GovernKathy L Hoagland is a 64 y.o. female former smoker with chronic obstructive asthma, and rhinitis.   TESTS: PFT 04/27/10 >> FEV1 2.17(90%), FEV1% 68, TLC 5.48(107%), DLCO 81%, +BD response.  10/19/2014 Follow up  Patient returns for a one-month follow-up for asthma Patient was seen last visit with an upper respiratory infection  and asthma flare She was given a Z-Pak. Feels her cough and congestion are improved [facility] Unfortunately, patient did not understand the instructions and did not pick this up at the pharmacy. We discussed the need to pick this up at the pharmacy and begin new prescription She states that her cough comes and goes, but it is better She is having some home construction done currently and dust. Does aggravate her. She denies any chest pain, orthopnea, PND or leg swelling     Review of Systems Constitutional:   No  weight loss, night sweats,  Fevers, chills,  +fatigue, or  lassitude.  HEENT:   No headaches,  Difficulty swallowing,  Tooth/dental problems, or  Sore throat,                No sneezing, itching, ear ache, nasal congestion, post nasal drip,   CV:  No chest pain,  Orthopnea, PND, swelling in lower extremities, anasarca, dizziness, palpitations, syncope.   GI  No heartburn, indigestion, abdominal pain, nausea, vomiting, diarrhea, change in bowel habits, loss of appetite, bloody stools.   Resp: .  No chest wall deformity  Skin: no rash or lesions.  GU: no dysuria, change in color of urine, no urgency or frequency.  No flank pain, no hematuria   MS:  No joint pain or swelling.  No decreased range of motion.  No back pain.  Psych:  No change in mood or affect. No depression or anxiety.  No memory loss.         Objective:   Physical Exam GEN: A/Ox3; pleasant , NAD   HEENT:  [place]/AT,  EACs-clear, TMs-wnl, NOSE-clear,  THROAT-clear, no lesions, no postnasal drip or exudate noted.   NECK:  Supple w/ fair ROM; no JVD; normal carotid impulses w/o bruits; no thyromegaly or nodules palpated; no lymphadenopathy.  RESP  Clear  P & A; w/o, wheezes/ rales/ or rhonchi.no accessory muscle use, no dullness to percussion  CARD:  RRR, no m/r/g  , no peripheral edema, pulses intact, no cyanosis or clubbing.  GI:   Soft & nt; nml bowel sounds; no organomegaly or masses detected.  Musco: Warm bil, no deformities or joint swelling noted.   Neuro: alert, no focal deficits noted.    Skin: Warm, no lesions or rashes         Assessment & Plan:

## 2014-10-19 NOTE — Patient Instructions (Addendum)
Begin Dulera 2 puffs Twice daily  , will need to pick up at pharmacy.  Follow up with family doctor as your blood pressure is elevated.  Follow up Dr. Craige CottaSood  In 2-3 months with PFT

## 2014-10-20 NOTE — Progress Notes (Signed)
Reviewed and agree with assessment/plan. 

## 2015-07-08 ENCOUNTER — Other Ambulatory Visit: Payer: Self-pay

## 2015-07-08 ENCOUNTER — Telehealth: Payer: Self-pay | Admitting: Pulmonary Disease

## 2015-07-08 DIAGNOSIS — Z1231 Encounter for screening mammogram for malignant neoplasm of breast: Secondary | ICD-10-CM

## 2015-07-08 NOTE — Telephone Encounter (Signed)
Last ov on 10/19/14 with TP Patient Instructions     Begin Dulera 2 puffs Twice daily , will need to pick up at pharmacy.  Follow up with family doctor as your blood pressure is elevated.  Follow up Dr. Craige CottaSood In 2-3 months with PFT    Called and spoke with pt. She stated that she needed to schedule a PFT that was suppose to be scheduled in May. She states she was unable to make a PFT or ov at that time. I offered her an PFT appointment on 08/05/15 at 4pm and a ov with VS on 12/15 at 3:30pm. Pt voiced understanding and had no further questions. Nothing further needed. Will sign off on message.

## 2015-07-10 ENCOUNTER — Other Ambulatory Visit: Payer: Self-pay | Admitting: Pulmonary Disease

## 2015-08-04 ENCOUNTER — Other Ambulatory Visit: Payer: Self-pay | Admitting: Pulmonary Disease

## 2015-08-04 DIAGNOSIS — R0600 Dyspnea, unspecified: Secondary | ICD-10-CM

## 2015-08-05 ENCOUNTER — Ambulatory Visit (INDEPENDENT_AMBULATORY_CARE_PROVIDER_SITE_OTHER): Payer: 59 | Admitting: Pulmonary Disease

## 2015-08-05 DIAGNOSIS — R0600 Dyspnea, unspecified: Secondary | ICD-10-CM

## 2015-08-05 LAB — PULMONARY FUNCTION TEST
DL/VA % pred: 73 %
DL/VA: 3.62 ml/min/mmHg/L
DLCO unc % pred: 73 %
DLCO unc: 18.77 ml/min/mmHg
FEF 25-75 POST: 1.36 L/s
FEF 25-75 Pre: 0.96 L/sec
FEF2575-%Change-Post: 41 %
FEF2575-%PRED-POST: 59 %
FEF2575-%Pred-Pre: 42 %
FEV1-%Change-Post: 10 %
FEV1-%PRED-PRE: 70 %
FEV1-%Pred-Post: 78 %
FEV1-PRE: 1.81 L
FEV1-Post: 2 L
FEV1FVC-%CHANGE-POST: 1 %
FEV1FVC-%PRED-PRE: 83 %
FEV6-%CHANGE-POST: 9 %
FEV6-%PRED-PRE: 86 %
FEV6-%Pred-Post: 94 %
FEV6-Post: 3.03 L
FEV6-Pre: 2.78 L
FEV6FVC-%Change-Post: 0 %
FEV6FVC-%Pred-Post: 102 %
FEV6FVC-%Pred-Pre: 102 %
FVC-%Change-Post: 9 %
FVC-%PRED-POST: 92 %
FVC-%PRED-PRE: 84 %
FVC-POST: 3.07 L
FVC-PRE: 2.82 L
POST FEV1/FVC RATIO: 65 %
PRE FEV1/FVC RATIO: 64 %
Post FEV6/FVC ratio: 99 %
Pre FEV6/FVC Ratio: 99 %
RV % pred: 143 %
RV: 3.01 L
TLC % pred: 113 %
TLC: 5.9 L

## 2015-08-05 NOTE — Progress Notes (Signed)
PFT done today. 

## 2015-08-09 ENCOUNTER — Other Ambulatory Visit: Payer: Self-pay | Admitting: Pulmonary Disease

## 2015-08-12 ENCOUNTER — Ambulatory Visit (INDEPENDENT_AMBULATORY_CARE_PROVIDER_SITE_OTHER): Payer: 59 | Admitting: Pulmonary Disease

## 2015-08-12 ENCOUNTER — Encounter: Payer: Self-pay | Admitting: Pulmonary Disease

## 2015-08-12 VITALS — BP 170/88 | HR 91 | Ht 65.5 in | Wt 138.0 lb

## 2015-08-12 DIAGNOSIS — J45909 Unspecified asthma, uncomplicated: Secondary | ICD-10-CM

## 2015-08-12 DIAGNOSIS — J449 Chronic obstructive pulmonary disease, unspecified: Secondary | ICD-10-CM | POA: Diagnosis not present

## 2015-08-12 MED ORDER — ALBUTEROL SULFATE HFA 108 (90 BASE) MCG/ACT IN AERS: 2.0000 | INHALATION_SPRAY | Freq: Four times a day (QID) | RESPIRATORY_TRACT | Status: AC | PRN

## 2015-08-12 MED ORDER — MOMETASONE FURO-FORMOTEROL FUM 100-5 MCG/ACT IN AERO: 2.0000 | INHALATION_SPRAY | Freq: Two times a day (BID) | RESPIRATORY_TRACT | Status: AC

## 2015-08-12 NOTE — Progress Notes (Signed)
Current Outpatient Prescriptions on File Prior to Visit  Medication Sig  . ALPRAZolam (XANAX) 0.5 MG tablet Take 0.5 mg by mouth as needed for anxiety.   . colesevelam (WELCHOL) 625 MG tablet Take 1,875 mg by mouth 2 (two) times daily with a meal.  . levothyroxine (SYNTHROID, LEVOTHROID) 75 MCG tablet Take 75 mcg by mouth daily before breakfast.  . metoprolol (LOPRESSOR) 50 MG tablet Take 50 mg by mouth daily.    No current facility-administered medications on file prior to visit.     Chief Complaint  Patient presents with  . Follow-up    review PFT. Reports some cough with moderate amount of yellow/green mucus. Needs refill of Albuterol hfa     Tests PFT 04/27/10 >> FEV1 2.17(90%), FEV1% 68, TLC 5.48(107%), DLCO 81%, +BD response. RAST 09/21/14 >> react to candida albicans; IgE 579 PFT 08/05/15 >> FEV1 2.00 (78%), FEV1% 65, TLC 5.90 (113%), RV 3.01 (143%), DLCO 73%, + BD   Past medical hx HTN, HLD, Hypothyroidism, Depression, Anxiety  Past surgical hx, Allergies, Family hx, Social hx all reviewed.  Vital Signs BP 170/88 mmHg  Pulse 91  Ht 5' 5.5" (1.664 m)  Wt 138 lb (62.596 kg)  BMI 22.61 kg/m2  SpO2 93%  History of Present Illness Elizabeth GovernKathy L Arndt is a 64 y.o. female former smoker with COPD and asthma.  Her breathing has been okay.  She gets cough when out in cold weather, dry air, or raking leaves.  She wants to know if she can get cheaper alternative to dulera.    She had pneumonia shot with PCP.  She is not sure how long ago it was, and whether she had prevnar or pneumovax.  Her PFT from 08/05/15 showed mild obstruction, air trapping, mild diffusion defect and positive bronchodilator response.  No significant change from 2011.  Physical Exam  General - No distress ENT - No sinus tenderness, no oral exudate, no LAN Cardiac - s1s2 regular, no murmur Chest - No wheeze/rales/dullness Back - No focal tenderness Abd - Soft, non-tender Ext - No edema Neuro -  Normal strength Skin - No rashes Psych - normal mood, and behavior   Assessment/Plan  COPD with asthma. Plan: - continue dulera - refill ventolin - she will check with her insurance about alternative ICS/LABA combinations that might be less expensive - she will check with PCP about whether she is due for booster pneumovax, and whether she needs prevnar   Patient Instructions  Ask your insurance company if there is an alternative to Lebanondulera >> tell them you have COPD with asthma and need an inhaled corticosteroid and long acting beta agonist combination inhaler  Follow up in 1 year     Coralyn HellingVineet Kellyanne Ellwanger, MD [place] [facility]/Sleep Pager:  [phone]

## 2015-08-12 NOTE — Patient Instructions (Signed)
Ask your insurance company if there is an alternative to Lebanondulera >> tell them you have COPD with asthma and need an inhaled corticosteroid and long acting beta agonist combination inhaler  Follow up in 1 year

## 2015-09-01 ENCOUNTER — Ambulatory Visit
Admission: RE | Admit: 2015-09-01 | Discharge: 2015-09-01 | Disposition: A | Discharge status: Home / Self Care | Payer: 59 | Source: Ambulatory Visit

## 2015-09-01 DIAGNOSIS — Z1231 Encounter for screening mammogram for malignant neoplasm of breast: Secondary | ICD-10-CM

## 2015-10-18 ENCOUNTER — Encounter: Payer: Self-pay | Admitting: Emergency Medicine

## 2015-10-18 ENCOUNTER — Ambulatory Visit (INDEPENDENT_AMBULATORY_CARE_PROVIDER_SITE_OTHER): Payer: 59 | Admitting: Emergency Medicine

## 2015-10-18 VITALS — BP 148/82 | HR 97 | Ht 65.5 in | Wt 135.8 lb

## 2015-10-18 DIAGNOSIS — J209 Acute bronchitis, unspecified: Secondary | ICD-10-CM | POA: Diagnosis not present

## 2015-10-18 DIAGNOSIS — J309 Allergic rhinitis, unspecified: Secondary | ICD-10-CM

## 2015-10-18 DIAGNOSIS — J45909 Unspecified asthma, uncomplicated: Secondary | ICD-10-CM | POA: Diagnosis not present

## 2015-10-18 DIAGNOSIS — J449 Chronic obstructive pulmonary disease, unspecified: Secondary | ICD-10-CM | POA: Diagnosis not present

## 2015-10-18 MED ORDER — DOXYCYCLINE HYCLATE 100 MG PO TABS: 100.0000 mg | ORAL_TABLET | Freq: Two times a day (BID) | ORAL | Status: DC

## 2015-10-18 MED ORDER — FLUTICASONE PROPIONATE 50 MCG/ACT NA SUSP: 2.0000 | Freq: Every day | NASAL | Status: AC

## 2015-10-18 NOTE — Progress Notes (Signed)
Subjective:    Patient ID: Elizabeth Newman, female    DOB: 1951/06/19, 65 y.o.   MRN: [id number]  HPI 65 year old woman with a history of allergic rhinitis, asthma, followed by Dr Craige Cotta. She had documented mild obstruction on spirometry in December 2016 associated with air trapping and a positive the bronchodilator response. She began to experience more nasal congestion, chest congestion, facial pain and fullness starting 5 days ago. She has had a small amount exertional SOB, is coughing up greenish brown. Sinus fullness.    Review of Systems As per history of present illness  Past Medical History  Diagnosis Date  . Hypothyroidism   . HTN (hypertension)   . Chronic obstructive asthma   . Allergic rhinitis   . Hyperlipidemia     high cholesterol. Elevated LFT's on statin  . Osteoporosis     BMD 2009  . Anxiety and depression   . Vitamin D deficiency   . History of nuclear stress test 2012    No Ischemia     Family History  Problem Relation Age of Onset  . Asthma Mother   . Asthma Paternal Grandfather   . Emphysema Mother   . Hypertension Father   . Bone cancer Maternal Grandfather   . Heart disease Maternal Grandmother   . Heart disease Paternal Grandmother   . Allergies Mother      Social History   Social History  . Marital Status: Married    Spouse Name: N/A  . Number of Children: N/A  . Years of Education: N/A   Occupational History  . data entry [employer]   Social History Main Topics  . Smoking status: Former Smoker -- 0.30 packs/day for 30 years    Types: Cigarettes    Quit date: 04/05/2011  . Smokeless tobacco: Not on file  . Alcohol Use: No  . Drug Use: No  . Sexual Activity: Not on file   Other Topics Concern  . Not on file   Social History Narrative     Allergies  Allergen Reactions  . Contrast Media [Iodinated Diagnostic Agents] Anaphylaxis  . Shellfish Allergy Swelling  . Betadine [Povidone Iodine] Other (See Comments)    Not  sure if has allergy, doesn't use because of contrast allergy     Outpatient Prescriptions Prior to Visit  Medication Sig Dispense Refill  . albuterol (VENTOLIN HFA) 108 (90 BASE) MCG/ACT inhaler Inhale 2 puffs into the lungs every 6 (six) hours as needed for wheezing. 1 Inhaler 5  . ALPRAZolam (XANAX) 0.5 MG tablet Take 0.5 mg by mouth as needed for anxiety.     . colesevelam (WELCHOL) 625 MG tablet Take 1,875 mg by mouth 2 (two) times daily with a meal.    . levothyroxine (SYNTHROID, LEVOTHROID) 75 MCG tablet Take 75 mcg by mouth daily before breakfast.    . metoprolol (LOPRESSOR) 50 MG tablet Take 50 mg by mouth daily.     . mometasone-formoterol (DULERA) 100-5 MCG/ACT AERO Inhale 2 puffs into the lungs 2 (two) times daily. 1 Inhaler 11   No facility-administered medications prior to visit.         Objective:   Physical Exam Filed Vitals:   10/18/15 1433  BP: 148/82  Pulse: 97  Height: 5' 5.5" (1.664 m)  Weight: 135 lb 12.8 oz (61.598 kg)  SpO2: 93%   Gen: Pleasant, well-nourished, in no distress,  normal affect  ENT: No lesions,  mouth clear,  oropharynx clear, no postnasal drip  Neck:  No JVD, no TMG, no carotid bruits  Lungs: No use of accessory muscles, clear without rales or rhonchi, no wheeze with a regular inspiration, expiration or forced expiration.  Cardiovascular: RRR, heart sounds normal, no murmur or gallops, no peripheral edema  Musculoskeletal: No deformities, no cyanosis or clubbing  Neuro: alert, non focal  Skin: Warm, no lesions or rashes      Assessment & Plan:  Allergic rhinitis Her syndrome sounds like a viral upper S4 infection but certainly allergic rhinitis may be playing a role here. We will try starting fluticasone nasal spray 2 sprays each nostril once a day  COPD with asthma No evidence of exacerbation currently. We will defer prednisone. Continue her dulera twice a day.  Acute bronchitis She has purulent mucus and evidence for  bronchitis with a possible superimposed sinusitis. This may be in the setting of a URI or active allergies. We will treat her with doxycycline, defer prednisone, use fluticasone nasal spray, use Tylenol Cold and flu when necessary. Follow-up in our office in 2-3 weeks to ensure that she is improving

## 2015-10-18 NOTE — Assessment & Plan Note (Signed)
No evidence of exacerbation currently. We will defer prednisone. Continue her dulera twice a day.

## 2015-10-18 NOTE — Patient Instructions (Signed)
Please take doxycycline  twice a day for 10 days.  Continue your Elwin Sleight twice a day  Start fluticasone nasal spray 2 sprays each nostril daily Try using Tylenol Cold and Flu as directed for symptoms.  Follow with Dr Craige Cotta or one of our NP in 2-3 weeks

## 2015-10-18 NOTE — Assessment & Plan Note (Signed)
Her syndrome sounds like a viral upper S4 infection but certainly allergic rhinitis may be playing a role here. We will try starting fluticasone nasal spray 2 sprays each nostril once a day

## 2015-10-18 NOTE — Assessment & Plan Note (Signed)
She has purulent mucus and evidence for bronchitis with a possible superimposed sinusitis. This may be in the setting of a URI or active allergies. We will treat her with doxycycline, defer prednisone, use fluticasone nasal spray, use Tylenol Cold and flu when necessary. Follow-up in our office in 2-3 weeks to ensure that she is improving

## 2015-10-18 NOTE — Addendum Note (Signed)
Addended by: Maisie Fus on: 10/18/2015 03:02 PM   Modules accepted: Orders

## 2015-10-26 ENCOUNTER — Telehealth: Payer: Self-pay | Admitting: Emergency Medicine

## 2015-10-26 NOTE — Telephone Encounter (Signed)
Pt has a pending appt with TP on 11/02/15. TP will not be in office on 11/02/15. Marchelle Folks was calling to rsc appt. Spoke with pt.  Appt rsc to 11/01/15 at 3:30 pm.   Pt confirmed new appt date and time and voiced no further questions or concerns at this time.

## 2015-11-01 ENCOUNTER — Encounter: Payer: Self-pay | Admitting: Adult Health

## 2015-11-01 ENCOUNTER — Ambulatory Visit (INDEPENDENT_AMBULATORY_CARE_PROVIDER_SITE_OTHER): Payer: 59 | Admitting: Adult Health

## 2015-11-01 VITALS — BP 134/70 | HR 92 | Temp 98.1°F | Ht 65.0 in | Wt 135.0 lb

## 2015-11-01 DIAGNOSIS — J45909 Unspecified asthma, uncomplicated: Secondary | ICD-10-CM

## 2015-11-01 DIAGNOSIS — J449 Chronic obstructive pulmonary disease, unspecified: Secondary | ICD-10-CM

## 2015-11-01 NOTE — Progress Notes (Signed)
Reviewed and agree assessment/plan.

## 2015-11-01 NOTE — Assessment & Plan Note (Signed)
Recent flare now resolved  Plan  Continue on current regimen .  Follow up with Dr. Craige CottaSood  As planned and As needed

## 2015-11-01 NOTE — Progress Notes (Signed)
   Subjective:    Patient ID: Elizabeth Newman, female    DOB: 03-15-1951, 65 y.o.   MRN: [id number]  HPI Elizabeth Newman is a 65 y.o. female former smoker with chronic obstructive asthma, and rhinitis.   TESTS: PFT 04/27/10 >> FEV1 2.17(90%), FEV1% 68, TLC 5.48(107%), DLCO 81%, +BD response.  11/01/2015 Follow up  Patient returns for a 2 week follow-up for asthma Patient was seen last visit with an upper respiratory infection  and asthma flare.  She was given doxycycline  Feels her cough and congestion are improved  She states that her cough comes and goes, but it is much better   She denies any chest pain, orthopnea, PND or leg swelling     Review of Systems Constitutional:   No  weight loss, night sweats,  Fevers, chills,  +fatigue, or  lassitude.  HEENT:   No headaches,  Difficulty swallowing,  Tooth/dental problems, or  Sore throat,                No sneezing, itching, ear ache, nasal congestion, post nasal drip,   CV:  No chest pain,  Orthopnea, PND, swelling in lower extremities, anasarca, dizziness, palpitations, syncope.   GI  No heartburn, indigestion, abdominal pain, nausea, vomiting, diarrhea, change in bowel habits, loss of appetite, bloody stools.   Resp: .  No chest wall deformity  Skin: no rash or lesions.  GU: no dysuria, change in color of urine, no urgency or frequency.  No flank pain, no hematuria   MS:  No joint pain or swelling.  No decreased range of motion.  No back pain.  Psych:  No change in mood or affect. No depression or anxiety.  No memory loss.         Objective:   Physical Exam  Filed Vitals:   11/01/15 1534  Pulse: 92  Temp: 98.1 F (36.7 C)  TempSrc: Oral  Height: 5\' 5"  (1.651 m)  Weight: 135 lb (61.236 kg)  SpO2: 92%    GEN: A/Ox3; pleasant , NAD   HEENT:  [place]/AT,  EACs-clear, TMs-wnl, NOSE-clear, THROAT-clear, no lesions, no postnasal drip or exudate noted.   NECK:  Supple w/ fair ROM; no JVD; normal carotid impulses w/o  bruits; no thyromegaly or nodules palpated; no lymphadenopathy.  RESP  Clear  P & A; w/o, wheezes/ rales/ or rhonchi.no accessory muscle use, no dullness to percussion  CARD:  RRR, no m/r/g  , no peripheral edema, pulses intact, no cyanosis or clubbing.  GI:   Soft & nt; nml bowel sounds; no organomegaly or masses detected.  Musco: Warm bil, no deformities or joint swelling noted.   Neuro: alert, no focal deficits noted.    Skin: Warm, no lesions or rashes  Tammy Parrett NP-C  [facility]  11/01/2015        Assessment & Plan:

## 2015-11-01 NOTE — Patient Instructions (Signed)
Continue on current regimen .  Follow up with Dr. Craige CottaSood  As planned and As needed

## 2015-11-02 ENCOUNTER — Ambulatory Visit: Payer: 59 | Admitting: Adult Health

## 2015-11-14 ENCOUNTER — Inpatient Hospital Stay (HOSPITAL_COMMUNITY): Payer: 59

## 2015-11-14 ENCOUNTER — Inpatient Hospital Stay (HOSPITAL_COMMUNITY)
Admission: EM | Admit: 2015-11-14 | Discharge: 2015-11-27 | DRG: 064 | Disposition: E | Payer: 59 | Attending: Neurology | Admitting: Neurology

## 2015-11-14 ENCOUNTER — Emergency Department (HOSPITAL_COMMUNITY): Payer: 59

## 2015-11-14 ENCOUNTER — Encounter (HOSPITAL_COMMUNITY): Payer: Self-pay | Admitting: Emergency Medicine

## 2015-11-14 DIAGNOSIS — R402312 Coma scale, best motor response, none, at arrival to emergency department: Secondary | ICD-10-CM | POA: Diagnosis present

## 2015-11-14 DIAGNOSIS — J9601 Acute respiratory failure with hypoxia: Secondary | ICD-10-CM

## 2015-11-14 DIAGNOSIS — I10 Essential (primary) hypertension: Secondary | ICD-10-CM | POA: Diagnosis present

## 2015-11-14 DIAGNOSIS — F419 Anxiety disorder, unspecified: Secondary | ICD-10-CM | POA: Diagnosis present

## 2015-11-14 DIAGNOSIS — Z66 Do not resuscitate: Secondary | ICD-10-CM | POA: Diagnosis not present

## 2015-11-14 DIAGNOSIS — E039 Hypothyroidism, unspecified: Secondary | ICD-10-CM | POA: Diagnosis present

## 2015-11-14 DIAGNOSIS — Z888 Allergy status to other drugs, medicaments and biological substances status: Secondary | ICD-10-CM | POA: Diagnosis not present

## 2015-11-14 DIAGNOSIS — G9340 Encephalopathy, unspecified: Secondary | ICD-10-CM | POA: Diagnosis present

## 2015-11-14 DIAGNOSIS — R2990 Unspecified symptoms and signs involving the nervous system: Secondary | ICD-10-CM

## 2015-11-14 DIAGNOSIS — Z87891 Personal history of nicotine dependence: Secondary | ICD-10-CM

## 2015-11-14 DIAGNOSIS — R402212 Coma scale, best verbal response, none, at arrival to emergency department: Secondary | ICD-10-CM | POA: Diagnosis present

## 2015-11-14 DIAGNOSIS — Z79899 Other long term (current) drug therapy: Secondary | ICD-10-CM | POA: Diagnosis not present

## 2015-11-14 DIAGNOSIS — Z91013 Allergy to seafood: Secondary | ICD-10-CM | POA: Diagnosis not present

## 2015-11-14 DIAGNOSIS — I613 Nontraumatic intracerebral hemorrhage in brain stem: Principal | ICD-10-CM | POA: Diagnosis present

## 2015-11-14 DIAGNOSIS — F329 Major depressive disorder, single episode, unspecified: Secondary | ICD-10-CM | POA: Diagnosis present

## 2015-11-14 DIAGNOSIS — Z515 Encounter for palliative care: Secondary | ICD-10-CM | POA: Diagnosis not present

## 2015-11-14 DIAGNOSIS — Z91041 Radiographic dye allergy status: Secondary | ICD-10-CM

## 2015-11-14 DIAGNOSIS — Z825 Family history of asthma and other chronic lower respiratory diseases: Secondary | ICD-10-CM

## 2015-11-14 DIAGNOSIS — M810 Age-related osteoporosis without current pathological fracture: Secondary | ICD-10-CM | POA: Diagnosis present

## 2015-11-14 DIAGNOSIS — I609 Nontraumatic subarachnoid hemorrhage, unspecified: Secondary | ICD-10-CM | POA: Diagnosis present

## 2015-11-14 DIAGNOSIS — R402112 Coma scale, eyes open, never, at arrival to emergency department: Secondary | ICD-10-CM | POA: Diagnosis present

## 2015-11-14 DIAGNOSIS — J9600 Acute respiratory failure, unspecified whether with hypoxia or hypercapnia: Secondary | ICD-10-CM | POA: Diagnosis present

## 2015-11-14 DIAGNOSIS — J309 Allergic rhinitis, unspecified: Secondary | ICD-10-CM | POA: Diagnosis present

## 2015-11-14 DIAGNOSIS — E785 Hyperlipidemia, unspecified: Secondary | ICD-10-CM | POA: Diagnosis present

## 2015-11-14 LAB — COMPREHENSIVE METABOLIC PANEL
ALT: 35 U/L (ref 14–54)
ANION GAP: 13 (ref 5–15)
AST: 56 U/L — ABNORMAL HIGH (ref 15–41)
Albumin: 3.7 g/dL (ref 3.5–5.0)
Alkaline Phosphatase: 80 U/L (ref 38–126)
BUN: 8 mg/dL (ref 6–20)
CHLORIDE: 104 mmol/L (ref 101–111)
CO2: 22 mmol/L (ref 22–32)
CREATININE: 0.59 mg/dL (ref 0.44–1.00)
Calcium: 9.2 mg/dL (ref 8.9–10.3)
Glucose, Bld: 243 mg/dL — ABNORMAL HIGH (ref 65–99)
POTASSIUM: 3.4 mmol/L — AB (ref 3.5–5.1)
SODIUM: 139 mmol/L (ref 135–145)
Total Bilirubin: 0.6 mg/dL (ref 0.3–1.2)
Total Protein: 7 g/dL (ref 6.5–8.1)

## 2015-11-14 LAB — I-STAT ARTERIAL BLOOD GAS, ED
ACID-BASE EXCESS: 1 mmol/L (ref 0.0–2.0)
BICARBONATE: 28.8 meq/L — AB (ref 20.0–24.0)
O2 Saturation: 98 %
PH ART: 7.337 — AB (ref 7.350–7.450)
TCO2: 31 mmol/L (ref 0–100)
pCO2 arterial: 52.7 mmHg — ABNORMAL HIGH (ref 35.0–45.0)
pO2, Arterial: 115 mmHg — ABNORMAL HIGH (ref 80.0–100.0)

## 2015-11-14 LAB — MRSA PCR SCREENING: MRSA by PCR: NEGATIVE

## 2015-11-14 LAB — CBC
HEMATOCRIT: 45.5 % (ref 36.0–46.0)
Hemoglobin: 15.2 g/dL — ABNORMAL HIGH (ref 12.0–15.0)
MCH: 31.5 pg (ref 26.0–34.0)
MCHC: 33.4 g/dL (ref 30.0–36.0)
MCV: 94.2 fL (ref 78.0–100.0)
PLATELETS: 256 10*3/uL (ref 150–400)
RBC: 4.83 MIL/uL (ref 3.87–5.11)
RDW: 12.7 % (ref 11.5–15.5)
WBC: 16.3 10*3/uL — AB (ref 4.0–10.5)

## 2015-11-14 LAB — I-STAT CHEM 8, ED
BUN: 10 mg/dL (ref 6–20)
CHLORIDE: 102 mmol/L (ref 101–111)
Calcium, Ion: 1.07 mmol/L — ABNORMAL LOW (ref 1.13–1.30)
Creatinine, Ser: 0.6 mg/dL (ref 0.44–1.00)
GLUCOSE: 227 mg/dL — AB (ref 65–99)
HCT: 61 % — ABNORMAL HIGH (ref 36.0–46.0)
HEMOGLOBIN: 20.7 g/dL — AB (ref 12.0–15.0)
POTASSIUM: 3.6 mmol/L (ref 3.5–5.1)
SODIUM: 139 mmol/L (ref 135–145)
TCO2: 26 mmol/L (ref 0–100)

## 2015-11-14 LAB — DIFFERENTIAL
BASOS PCT: 1 %
Basophils Absolute: 0.1 10*3/uL (ref 0.0–0.1)
EOS ABS: 0.6 10*3/uL (ref 0.0–0.7)
EOS PCT: 4 %
Lymphocytes Relative: 24 %
Lymphs Abs: 4 10*3/uL (ref 0.7–4.0)
MONO ABS: 1.1 10*3/uL — AB (ref 0.1–1.0)
MONOS PCT: 7 %
NEUTROS ABS: 10.6 10*3/uL — AB (ref 1.7–7.7)
Neutrophils Relative %: 64 %

## 2015-11-14 LAB — URINALYSIS, ROUTINE W REFLEX MICROSCOPIC
BILIRUBIN URINE: NEGATIVE
Glucose, UA: 100 mg/dL — AB
Hgb urine dipstick: NEGATIVE
KETONES UR: NEGATIVE mg/dL
LEUKOCYTES UA: NEGATIVE
NITRITE: NEGATIVE
Protein, ur: NEGATIVE mg/dL
Specific Gravity, Urine: 1.008 (ref 1.005–1.030)
pH: 7.5 (ref 5.0–8.0)

## 2015-11-14 LAB — I-STAT TROPONIN, ED: TROPONIN I, POC: 0 ng/mL (ref 0.00–0.08)

## 2015-11-14 LAB — GLUCOSE, CAPILLARY: Glucose-Capillary: 145 mg/dL — ABNORMAL HIGH (ref 65–99)

## 2015-11-14 LAB — RAPID URINE DRUG SCREEN, HOSP PERFORMED
AMPHETAMINES: NOT DETECTED
Barbiturates: NOT DETECTED
Benzodiazepines: NOT DETECTED
Cocaine: NOT DETECTED
Opiates: NOT DETECTED
TETRAHYDROCANNABINOL: NOT DETECTED

## 2015-11-14 LAB — OSMOLALITY
OSMOLALITY: 298 mosm/kg — AB (ref 275–295)
Osmolality: 290 mOsm/kg (ref 275–295)

## 2015-11-14 LAB — SODIUM
SODIUM: 138 mmol/L (ref 135–145)
SODIUM: 141 mmol/L (ref 135–145)

## 2015-11-14 LAB — ETHANOL

## 2015-11-14 LAB — PROTIME-INR
INR: 1 (ref 0.00–1.49)
PROTHROMBIN TIME: 13.4 s (ref 11.6–15.2)

## 2015-11-14 LAB — TRIGLYCERIDES: TRIGLYCERIDES: 200 mg/dL — AB (ref <150)

## 2015-11-14 LAB — APTT: APTT: 27 s (ref 24–37)

## 2015-11-14 MED ORDER — NIMODIPINE 60 MG/20ML PO SOLN
60.0000 mg | ORAL | Status: DC
  Administered 2015-11-14: 60 mg via ORAL
  Filled 2015-11-14 (×2): qty 20

## 2015-11-14 MED ORDER — ARFORMOTEROL TARTRATE 15 MCG/2ML IN NEBU: 15.0000 ug | INHALATION_SOLUTION | Freq: Two times a day (BID) | RESPIRATORY_TRACT | Status: DC

## 2015-11-14 MED ORDER — NOREPINEPHRINE BITARTRATE 1 MG/ML IV SOLN
0.0000 ug/min | INTRAVENOUS | Status: DC
  Filled 2015-11-14: qty 4

## 2015-11-14 MED ORDER — CLEVIDIPINE BUTYRATE 0.5 MG/ML IV EMUL
0.0000 mg/h | INTRAVENOUS | Status: DC
  Administered 2015-11-14: 18 mg/h via INTRAVENOUS
  Administered 2015-11-14: 0 mg/h via INTRAVENOUS
  Administered 2015-11-14: 18 mg/h via INTRAVENOUS
  Administered 2015-11-14 (×2): 21 mg/h via INTRAVENOUS
  Filled 2015-11-14 (×5): qty 50

## 2015-11-14 MED ORDER — FENTANYL CITRATE (PF) 100 MCG/2ML IJ SOLN
100.0000 ug | INTRAMUSCULAR | Status: DC | PRN
  Administered 2015-11-14: 100 ug via INTRAVENOUS

## 2015-11-14 MED ORDER — SODIUM CHLORIDE 0.9 % IV SOLN
1500.0000 mg | Freq: Two times a day (BID) | INTRAVENOUS | Status: DC
  Administered 2015-11-14: 1500 mg via INTRAVENOUS
  Filled 2015-11-14 (×2): qty 15

## 2015-11-14 MED ORDER — ACETAMINOPHEN 650 MG RE SUPP
650.0000 mg | RECTAL | Status: DC | PRN
  Administered 2015-11-14: 650 mg via RECTAL
  Filled 2015-11-14: qty 1

## 2015-11-14 MED ORDER — SODIUM CHLORIDE 3 % IV SOLN
INTRAVENOUS | Status: DC
  Filled 2015-11-14: qty 500

## 2015-11-14 MED ORDER — HYDRALAZINE HCL 20 MG/ML IJ SOLN
10.0000 mg | Freq: Four times a day (QID) | INTRAMUSCULAR | Status: DC | PRN
  Administered 2015-11-14: 10 mg via INTRAVENOUS
  Filled 2015-11-14: qty 1

## 2015-11-14 MED ORDER — MORPHINE BOLUS VIA INFUSION
5.0000 mg | INTRAVENOUS | Status: DC | PRN
  Filled 2015-11-14: qty 20

## 2015-11-14 MED ORDER — DOCUSATE SODIUM 50 MG/5ML PO LIQD
100.0000 mg | Freq: Two times a day (BID) | ORAL | Status: DC | PRN
  Filled 2015-11-14: qty 10

## 2015-11-14 MED ORDER — SODIUM CHLORIDE 0.9 % IV SOLN
INTRAVENOUS | Status: DC
  Administered 2015-11-14: 17:00:00 via INTRAVENOUS

## 2015-11-14 MED ORDER — LEVOTHYROXINE SODIUM 100 MCG IV SOLR: 37.5000 ug | Freq: Every day | INTRAVENOUS | Status: DC

## 2015-11-14 MED ORDER — CHLORHEXIDINE GLUCONATE 0.12% ORAL RINSE (MEDLINE KIT)
15.0000 mL | Freq: Two times a day (BID) | OROMUCOSAL | Status: DC
  Administered 2015-11-14: 15 mL via OROMUCOSAL

## 2015-11-14 MED ORDER — MANNITOL 25 % IV SOLN
25.0000 g | INTRAVENOUS | Status: AC
  Administered 2015-11-14: 25 g via INTRAVENOUS
  Filled 2015-11-14 (×2): qty 100

## 2015-11-14 MED ORDER — ROCURONIUM BROMIDE 50 MG/5ML IV SOLN
INTRAVENOUS | Status: AC | PRN
  Administered 2015-11-14: 50 mg via INTRAVENOUS

## 2015-11-14 MED ORDER — DEXTROSE 5 % IV SOLN
10.0000 mg/h | INTRAVENOUS | Status: DC
  Filled 2015-11-14: qty 10

## 2015-11-14 MED ORDER — ACETAMINOPHEN 325 MG PO TABS: 650.0000 mg | ORAL_TABLET | ORAL | Status: DC | PRN

## 2015-11-14 MED ORDER — SENNOSIDES-DOCUSATE SODIUM 8.6-50 MG PO TABS
1.0000 | ORAL_TABLET | Freq: Two times a day (BID) | ORAL | Status: DC
  Filled 2015-11-14: qty 1

## 2015-11-14 MED ORDER — NICARDIPINE HCL IN NACL 20-0.86 MG/200ML-% IV SOLN
3.0000 mg/h | Freq: Once | INTRAVENOUS | Status: AC
  Administered 2015-11-14: 5 mg/h via INTRAVENOUS
  Filled 2015-11-14: qty 200

## 2015-11-14 MED ORDER — PROPOFOL 1000 MG/100ML IV EMUL
0.0000 ug/kg/min | INTRAVENOUS | Status: DC
  Administered 2015-11-14: 5 ug/kg/min via INTRAVENOUS
  Administered 2015-11-14: 50 ug/kg/min via INTRAVENOUS
  Filled 2015-11-14: qty 100

## 2015-11-14 MED ORDER — FENTANYL CITRATE (PF) 100 MCG/2ML IJ SOLN
100.0000 ug | INTRAMUSCULAR | Status: DC | PRN
  Filled 2015-11-14: qty 2

## 2015-11-14 MED ORDER — STROKE: EARLY STAGES OF RECOVERY BOOK
Freq: Once | Status: AC
  Administered 2015-11-14: 10:00:00
  Filled 2015-11-14: qty 1

## 2015-11-14 MED ORDER — LABETALOL HCL 5 MG/ML IV SOLN
10.0000 mg | INTRAVENOUS | Status: DC | PRN
  Administered 2015-11-14 (×2): 20 mg via INTRAVENOUS
  Filled 2015-11-14 (×2): qty 4

## 2015-11-14 MED ORDER — BUDESONIDE 0.25 MG/2ML IN SUSP: 0.2500 mg | Freq: Two times a day (BID) | RESPIRATORY_TRACT | Status: DC

## 2015-11-14 MED ORDER — PROPOFOL 1000 MG/100ML IV EMUL
INTRAVENOUS | Status: DC
Start: 2015-11-14 — End: 2015-11-15
  Filled 2015-11-14: qty 100

## 2015-11-14 MED ORDER — LABETALOL HCL 5 MG/ML IV SOLN: 40.0000 mg | INTRAVENOUS | Status: DC

## 2015-11-14 MED ORDER — ANTISEPTIC ORAL RINSE SOLUTION (CORINZ)
7.0000 mL | OROMUCOSAL | Status: DC
  Administered 2015-11-14 (×2): 7 mL via OROMUCOSAL

## 2015-11-14 MED ORDER — SODIUM CHLORIDE 0.9 % IV SOLN
1000.0000 mg | Freq: Once | INTRAVENOUS | Status: DC
  Filled 2015-11-14: qty 10

## 2015-11-14 MED ORDER — ETOMIDATE 2 MG/ML IV SOLN
INTRAVENOUS | Status: AC | PRN
  Administered 2015-11-14: 15 mg via INTRAVENOUS

## 2015-11-14 MED ORDER — PANTOPRAZOLE SODIUM 40 MG IV SOLR: 40.0000 mg | Freq: Every day | INTRAVENOUS | Status: DC

## 2015-11-27 NOTE — Consult Note (Signed)
PULMONARY / CRITICAL CARE MEDICINE   Name: Elizabeth Newman MRN: [id number] DOB: 12-16-1950    ADMISSION DATE:  13-Dec-2015 CONSULTATION DATE:  08-14-16  REFERRING MD:  Dr Lavon PaganiniNandigam  CHIEF COMPLAINT:  Acute altered MS, VDRF  HISTORY OF PRESENT ILLNESS:   65 year old woman followed in our office for history of asthma, allergic rhinitis also with a history of hypertension, hyperlipidemia, hypothyroidism. She was reportedly well until morning of 11/13/16 when she suddenly experienced dyspnea, blindness, discoordination. In ED she was initially following commands but she had progressive deterioration of her mental status requiring intubation. CT scan in the emergency department revealed a large subarachnoid hemorrhage and brain stem hemorrhage. She was brought to the ICU for further care. Neurosurgical evaluation unfortunately reveals that this is likely a terminal event. PCCM consulted for assistance with ventilator and medical management.  PAST MEDICAL HISTORY :  She  has a past medical history of Hypothyroidism; HTN (hypertension); Chronic obstructive asthma; Allergic rhinitis; Hyperlipidemia; Osteoporosis; Anxiety and depression; Vitamin D deficiency; and History of nuclear stress test (2012).  PAST SURGICAL HISTORY: She  has past surgical history that includes Back surgery (1993 and 2000) and Cholecystectomy (N/A, 05/20/2013).  Allergies  Allergen Reactions  . Contrast Media [Iodinated Diagnostic Agents] Anaphylaxis  . Shellfish Allergy Swelling  . Betadine [Povidone Iodine] Other (See Comments)    Not sure if has allergy, doesn't use because of contrast allergy    No current facility-administered medications on file prior to encounter.   Current Outpatient Prescriptions on File Prior to Encounter  Medication Sig  . albuterol (VENTOLIN HFA) 108 (90 BASE) MCG/ACT inhaler Inhale 2 puffs into the lungs every 6 (six) hours as needed for wheezing.  Marland Kitchen. ALPRAZolam (XANAX) 0.5 MG tablet Take 0.5  mg by mouth as needed for anxiety.   . Chlorpheniramine-DM (CORICIDIN HBP COUGH/COLD PO) Take 1 capsule by mouth as directed. Reported on 11/01/2015  . colesevelam (WELCHOL) 625 MG tablet Take 1,875 mg by mouth 2 (two) times daily with a meal.  . fluticasone (FLONASE) 50 MCG/ACT nasal spray Place 2 sprays into both nostrils daily.  Marland Kitchen. levothyroxine (SYNTHROID, LEVOTHROID) 75 MCG tablet Take 75 mcg by mouth daily before breakfast.  . metoprolol (LOPRESSOR) 50 MG tablet Take 50 mg by mouth daily.   . mometasone-formoterol (DULERA) 100-5 MCG/ACT AERO Inhale 2 puffs into the lungs 2 (two) times daily.    FAMILY HISTORY:  Her has no family status information on file.   SOCIAL HISTORY: She  reports that she quit smoking about 4 years ago. Her smoking use included Cigarettes. She has a 9 pack-year smoking history. She does not have any smokeless tobacco history on file. She reports that she does not drink alcohol or use illicit drugs.  REVIEW OF SYSTEMS:   Unable to obtain  SUBJECTIVE:  Unable to obtain  VITAL SIGNS: BP 139/59 mmHg  Pulse 87  Temp(Src) 97 F (36.1 C) (Core (Comment))  Resp 20  Ht 5\' 6"  (1.676 m)  Wt 59.7 kg (131 lb 9.8 oz)  BMI 21.25 kg/m2  SpO2 99%  HEMODYNAMICS:    VENTILATOR SETTINGS: Vent Mode:  [-] PRVC FiO2 (%):  [60 %-80 %] 60 % Set Rate:  [16 bmp-20 bmp] 20 bmp Vt Set:  [460 mL] 460 mL PEEP:  [5 cmH20] 5 cmH20 Plateau Pressure:  [14 cmH20] 14 cmH20  INTAKE / OUTPUT:    PHYSICAL EXAMINATION: General:  Ill appearing woman, intubated Neuro:  Unresponsive, does breathe over the set ventilator rate, some  grimace with stim HEENT:  Pupils dilated and fixed bilaterally, ETT in place Cardiovascular:  Regular, no M Lungs:  Clear B  Abdomen:  Soft, NT + BS Musculoskeletal:  No deformities Skin:  No rash  LABS:  BMET  Recent Labs Lab 2015/12/12 0827 12-Dec-2015 0912 Dec 12, 2015 1013  NA 139 139 138  K 3.4* 3.6  --   CL 104 102  --   CO2 22  --   --    BUN 8 10  --   CREATININE 0.59 0.60  --   GLUCOSE 243* 227*  --     Electrolytes  Recent Labs Lab 12/12/15 0827  CALCIUM 9.2    CBC  Recent Labs Lab 12/12/15 0827 12-12-15 0912  WBC 16.3*  --   HGB 15.2* 20.7*  HCT 45.5 61.0*  PLT 256  --     Coag's  Recent Labs Lab 12-12-15 0909  APTT 27  INR 1.00    Sepsis Markers No results for input(s): LATICACIDVEN, PROCALCITON, O2SATVEN in the last 168 hours.  ABG  Recent Labs Lab 12/12/2015 1053  PHART 7.337*  PCO2ART 52.7*  PO2ART 115.0*    Liver Enzymes  Recent Labs Lab 12/12/15 0827  AST 56*  ALT 35  ALKPHOS 80  BILITOT 0.6  ALBUMIN 3.7    Cardiac Enzymes No results for input(s): TROPONINI, PROBNP in the last 168 hours.  Glucose No results for input(s): GLUCAP in the last 168 hours.  Imaging Dg Chest Portable 1 View  Dec 12, 2015  CLINICAL DATA:  Intubated. EXAM: PORTABLE CHEST 1 VIEW COMPARISON:  09/21/2014. FINDINGS: Endotracheal tube in satisfactory position, 3.9 cm above the carina. Normal sized heart. Minimal linear scarring at the left lung base. Otherwise, clear lungs. Unremarkable bones. IMPRESSION: 1. Satisfactory position of the endotracheal tube. 2. No acute abnormality. Electronically Signed   By: Beckie Salts M.D.   On: 12/12/15 10:45   Ct Head Code Stroke W/o Cm  2015-12-12  CLINICAL DATA:  Code stroke.  Slurred speech.  Facial droop. EXAM: CT HEAD WITHOUT CONTRAST TECHNIQUE: Contiguous axial images were obtained from the base of the skull through the vertex without intravenous contrast. COMPARISON:  None. FINDINGS: There is a large volume of acute subarachnoid hemorrhage throughout the basilar cisterns, bilateral sylvian fissures and bilateral frontal convexity sulci. There is intraparenchymal hemorrhage in the central midbrain. There is intraventricular hemorrhage in the third and fourth ventricles. There is symmetric mild dilatation of the temporal horns of the lateral ventricles. No  hemorrhage is seen within the lateral ventricles. No midline shift. There is a nonspecific periventricular and subcortical white matter hypodensity throughout both cerebral hemispheres. No CT evidence of acute transcortical infarction. Cerebral volume is age appropriate. Mucoperiosteal thickening in the bilateral inferior maxillary sinuses and bilateral ethmoidal air cells. No fluid levels in the paranasal sinuses. The mastoid air cells are unopacified. No evidence of calvarial fracture. IMPRESSION: 1. Large volume acute subarachnoid hemorrhage throughout the basilar cisterns, bilateral sylvian fissures and bilateral frontal convexity sulci. Intraparenchymal hemorrhage in the central midbrain. No midline shift. Findings are most worrisome for ruptured aneurysm or brainstem vascular malformation. 2. Intraventricular hemorrhage in the third and fourth ventricles. Mild dilatation of the temporal horns of the lateral ventricles suggesting early acute hydrocephalus. 3. Nonspecific periventricular and subcortical white matter hypodensity throughout both cerebral hemispheres, considerations include transependymal CSF flow and/or chronic small vessel ischemia. Critical Value/emergent results were called by telephone at the time of interpretation on Dec 12, 2015 at 9:18 am to Dr. Patsey Berthold ,  who verbally acknowledged these results. Electronically Signed   By: Delbert Phenix M.D.   On: December 06, 2015 09:27     STUDIES:  Head CT 3/19 >> large volume subarachnoid hemorrhage throughout the basilar cisterns, sylvian fissures, frontal convexity sulci. Intraventricular hemorrhage in the third and fourth ventricles with dilation of the temporal horns  CULTURES:  ANTIBIOTICS:  SIGNIFICANT EVENTS:  LINES/TUBES: ETT 3/19 >>   DISCUSSION: 65 year old woman with history of hypertension, asthma, who is admitted with a devastating subarachnoid hemorrhage. She is intubated and sedated  ASSESSMENT / PLAN:  NEUROLOGIC A:    Acute subarachnoid hemorrhage. Based on neurosurgery and neurology evaluation this is unfortunately a devastating event. P:   RASS goal: -1 Sedation with propofol and fentanyl No neurosurgical intervention planned  PULMONARY A: Acute respiratory failure due to altered mental status P:   PRVc 8cc/kg  Consider spontaneous breathing trials if her neurological status improves Nebulized Pulmicort and Brovana  CARDIOVASCULAR A:  hypertension P:  Goal systolic 120-140 on cleviprex  RENAL A:   No acute issues P:   Follow BMP  GASTROINTESTINAL A:   SUP P:   SCD's  HEMATOLOGIC A:   Acute hemorrhage, no evidence of coagulopathy P:  Follow CBC and coags  INFECTIOUS A:   Evidence of acute infection P:   Follow clinically, at some risk for aspiration given her altered mental status and poor airway protection  ENDOCRINE A:   hypothyroidism   P:   Synthroid IV   FAMILY  - Updates: I spoke with her family at bedside, updated them on her stability and her prognosis. We agreed to continue current level of care, but that we would not escalate if she were to arrest - no ACLS or CPR. I will place limited code orders.   - Inter-disciplinary family meet or Palliative Care meeting due by: 11/20/15  Independent CC time 45 minutes  Levy Pupa, MD, PhD 12/06/15, 1:35 PM [facility] [phone] or if no answer [phone]

## 2015-11-27 NOTE — Procedures (Signed)
Extubation Procedure Note  Patient Details:   Name: Orville GovernKathy L Koltz DOB: 11-Jul-1951 MRN: [id number]   Airway Documentation:  Airway 8 mm (Active)  Secured at (cm) 24 cm 04-25-2016  4:43 PM  Measured From Lips 04-25-2016  4:43 PM  Secured Location Center 04-25-2016  4:43 PM  Secured By [employer] Tube Holder 04-25-2016  4:43 PM  Tube Holder Repositioned Yes 04-25-2016  4:43 PM  Cuff Pressure (cm H2O) 28 cm H2O 04-25-2016  4:43 PM  Site Condition Dry 04-25-2016  4:43 PM    Evaluation  O2 sats: Patient terminally extubated per family and MD. Complications: No apparent complications Patient did tolerate procedure well. Bilateral Breath Sounds: Clear   No   Patients family wanted patient extubated to remain more comfortable during her dying process. RN called Doctor Delford FieldWright who officially said it was ok to remove the ETT. RN was at the bedside with RT. Family stepped outside of room while patient was extubated.   Darolyn Ruashley M Rickie Gange 04-25-2016, 6:30 PM

## 2015-11-27 NOTE — Progress Notes (Signed)
CDS Referral made at 1730 3/19. CDS number [id number]. Spoke with April Shore. Onalee HuaDavid called back from CDS shortly after Referral was made and will be sending someone out to see her.

## 2015-11-27 NOTE — Progress Notes (Signed)
CDS was present at the time of death. Stated pt will not be a donor and no need to call CDS.

## 2015-11-27 NOTE — Discharge Summary (Signed)
Admitting diagnosis: Brainstem hemorrhage, subarachnoid hemorrhage  Cause of death: Cardiac arrest secondary to severe subarachnoid hemorrhage  Date and time of death: 02/12/2016, 1845  Hospital course                                                                                                                                         Elizabeth GovernKathy L Newman is an 65 y.o. female patient who presented with altered mental status and possibly had a seizure-like episode with loss of responsiveness, was eventually intubated in the ER. A CT of the head showed a brain stem hemorrhage with extensive subarachnoid hemorrhage involving all her basal cisterns and bilateral sylvian fissures as well as cortical sulci. Her blood pressure was extremely elevated at 260 systolic and the EMTs found her at home. While in the ER she was on nicardipine drip initially and had difficulty in controlling her blood pressure with nicardipine and was switched clevidipine, with continued when necessary doses of labetalol. She was transferred to ICU for further care was intubated.   Neurosurgery consulted on the patient while in the ER, due to the extensive hemorrhage with likely poor prognosis, no interventions are planned at that time An EEG was done in the ER showed evidence of encephalopathy, no seizures.  Patient continued to deteriorate while in the ICU. Patient passed away at 1645. Family members were at bedside.

## 2015-11-27 NOTE — Code Documentation (Addendum)
[name] is a 65 yo w[facility] for sudden onset HA, nausea, & vomiting. A code stroke was called in the ED by Dr. Ronne BinningWray when the pt was found to be hypertensive (240s/120s) and drowsy.  On assessment she was able to answer all questions with mild dysrthria, no strength deficits, or sensory impairments.  NIH 2 was scored for facial asymmetry and dysarthria, though unable to assess visual due to decline in condition.  During the exam the pt became unresponsive and began to seize, as well as dropping her HR to 38.  She was emergently intubated and Cardene was started to treat BPs 240s/120s.  The family was updated.

## 2015-11-27 NOTE — H&P (Signed)
Requesting Physician: Dr. Rosalia Hammersay      Reason for consultation: Subarachnoid hemorrhage  HPI:                                                                                                                                         Orville GovernKathy L Massing is an 65 y.o. female patient who presented with altered mental status, severe headache, nausea and vomiting. She had severely elevated blood pressure of 260 systolic when the EMTs arrived at her home. At the time of evaluation patient was intubated in the ER for a reproduction. Per description from the ER physician and the nursing staff, patient had some gaze deviation seizure-like activity few minutes prior to intubation. She started on nicardipine drip and blood pressure continues to be about 200 .   Date last known well:  May 15, 2016 Time last known well:  7 am tPA Given: No: Intracerebral hemorrhage, subarachnoid hemorrhage  Stroke Risk Factors - hypertension  Past Medical History: Past Medical History  Diagnosis Date  . Hypothyroidism   . HTN (hypertension)   . Chronic obstructive asthma   . Allergic rhinitis   . Hyperlipidemia     high cholesterol. Elevated LFT's on statin  . Osteoporosis     BMD 2009  . Anxiety and depression   . Vitamin D deficiency   . History of nuclear stress test 2012    No Ischemia    Past Surgical History  Procedure Laterality Date  . Back surgery  1993 and 2000  . Cholecystectomy N/A 05/20/2013    Procedure: LAPAROSCOPIC CHOLECYSTECTOMY WITH INTRAOPERATIVE CHOLANGIOGRAM;  Surgeon: Valarie MerinoMatthew B Martin, MD;  Location: [facility];  Service: General;  Laterality: N/A;    Family History: Family History  Problem Relation Age of Onset  . Asthma Mother   . Asthma Paternal Grandfather   . Emphysema Mother   . Hypertension Father   . Bone cancer Maternal Grandfather   . Heart disease Maternal Grandmother   . Heart disease Paternal Grandmother   . Allergies Mother     Social History:   reports that she quit smoking  about 4 years ago. Her smoking use included Cigarettes. She has a 9 pack-year smoking history. She does not have any smokeless tobacco history on file. She reports that she does not drink alcohol or use illicit drugs.  Allergies:  Allergies  Allergen Reactions  . Contrast Media [Iodinated Diagnostic Agents] Anaphylaxis  . Shellfish Allergy Swelling  . Betadine [Povidone Iodine] Other (See Comments)    Not sure if has allergy, doesn't use because of contrast allergy     Medications:  Current facility-administered medications:  .   stroke: mapping our early stages of recovery book, , Does not apply, Once, Gladstone Rosas Daniel Nones, MD .  acetaminophen (TYLENOL) tablet 650 mg, 650 mg, Oral, Q4H PRN **OR** acetaminophen (TYLENOL) suppository 650 mg, 650 mg, Rectal, Q4H PRN, Myasia Sinatra Daniel Nones, MD .  clevidipine (CLEVIPREX) infusion 0.5 mg/mL, 0-21 mg/hr, Intravenous, Continuous, Tiajah Oyster Daniel Nones, MD .  docusate (COLACE) 50 MG/5ML liquid 100 mg, 100 mg, Per Tube, BID PRN, Leslye Peer, MD .  etomidate (AMIDATE) injection, , Intravenous, PRN, Margarita Grizzle, MD, 15 mg at 12-01-2015 8469 .  fentaNYL (SUBLIMAZE) injection 100 mcg, 100 mcg, Intravenous, Q15 min PRN, Leslye Peer, MD .  fentaNYL (SUBLIMAZE) injection 100 mcg, 100 mcg, Intravenous, Q2H PRN, Leslye Peer, MD .  labetalol (NORMODYNE,TRANDATE) injection 10-40 mg, 10-40 mg, Intravenous, Q10 min PRN, Kevontay Burks Daniel Nones, MD .  levETIRAcetam (KEPPRA) 1,000 mg in sodium chloride 0.9 % 100 mL IVPB, 1,000 mg, Intravenous, Once, Margarita Grizzle, MD .  pantoprazole (PROTONIX) injection 40 mg, 40 mg, Intravenous, Daily, Leslye Peer, MD .  propofol (DIPRIVAN) 1000 MG/100ML infusion, 0-50 mcg/kg/min, Intravenous, Continuous, Leslye Peer, MD .  rocuronium [facility]) injection, , Intravenous, PRN,  Margarita Grizzle, MD, 50 mg at December 01, 2015 0923 .  senna-docusate (Senokot-S) tablet 1 tablet, 1 tablet, Oral, BID, Kerrie Latour Daniel Nones, MD  Current outpatient prescriptions:  .  albuterol (VENTOLIN HFA) 108 (90 BASE) MCG/ACT inhaler, Inhale 2 puffs into the lungs every 6 (six) hours as needed for wheezing., Disp: 1 Inhaler, Rfl: 5 .  ALPRAZolam (XANAX) 0.5 MG tablet, Take 0.5 mg by mouth as needed for anxiety. , Disp: , Rfl:  .  Chlorpheniramine-DM (CORICIDIN HBP COUGH/COLD PO), Take 1 capsule by mouth as directed. Reported on 11/01/2015, Disp: , Rfl:  .  colesevelam (WELCHOL) 625 MG tablet, Take 1,875 mg by mouth 2 (two) times daily with a meal., Disp: , Rfl:  .  fluticasone (FLONASE) 50 MCG/ACT nasal spray, Place 2 sprays into both nostrils daily., Disp: 16 g, Rfl: 2 .  levothyroxine (SYNTHROID, LEVOTHROID) 75 MCG tablet, Take 75 mcg by mouth daily before breakfast., Disp: , Rfl:  .  metoprolol (LOPRESSOR) 50 MG tablet, Take 50 mg by mouth daily. , Disp: , Rfl:  .  mometasone-formoterol (DULERA) 100-5 MCG/ACT AERO, Inhale 2 puffs into the lungs 2 (two) times daily., Disp: 1 Inhaler, Rfl: 11   ROS:                                                                                                                                       History  unobtainable from patient due to mental status  Neurologic Examination:  Today's Vitals   11/17/2015 0922 11/17/15 0926 17-Nov-2015 0928 November 17, 2015 0933  BP: 245/169 245/169 268/108 233/98  Pulse: 125 119  108  Temp:      TempSrc:      Resp: 17   16  Height:      SpO2: 100% 96%  98%  PainSc:       Patient intubated, sedated propofol. Bilateral pupils 7 mm non reactive, no corneal response.   No motor response.      Lab Results: Basic Metabolic Panel:  Recent Labs Lab 2015/11/17 0827 2015/11/17 0912  NA 139 139  K 3.4* 3.6  CL 104 102  CO2 22  --    GLUCOSE 243* 227*  BUN 8 10  CREATININE 0.59 0.60  CALCIUM 9.2  --     Liver Function Tests:  Recent Labs Lab 2015/11/17 0827  AST 56*  ALT 35  ALKPHOS 80  BILITOT 0.6  PROT 7.0  ALBUMIN 3.7   No results for input(s): LIPASE, AMYLASE in the last 168 hours. No results for input(s): AMMONIA in the last 168 hours.  CBC:  Recent Labs Lab 11/17/2015 0827 17-Nov-2015 0912  WBC 16.3*  --   NEUTROABS 10.6*  --   HGB 15.2* 20.7*  HCT 45.5 61.0*  MCV 94.2  --   PLT 256  --     Cardiac Enzymes: No results for input(s): CKTOTAL, CKMB, CKMBINDEX, TROPONINI in the last 168 hours.  Lipid Panel: No results for input(s): CHOL, TRIG, HDL, CHOLHDL, VLDL, LDLCALC in the last 168 hours.  CBG: No results for input(s): GLUCAP in the last 168 hours.  Microbiology: Results for orders placed or performed during the hospital encounter of 05/19/13  Surgical pcr screen     Status: None   Collection Time: 05/20/13  5:59 AM  Result Value Ref Range Status   MRSA, PCR NEGATIVE NEGATIVE Final   Staphylococcus aureus NEGATIVE NEGATIVE Final    Comment:        The Xpert SA Assay (FDA approved for NASAL specimens in patients over 11 years of age), is one component of a comprehensive surveillance program.  Test performance has been validated by [employer] for patients greater than or equal to 63 year old. It is not intended to diagnose infection nor to guide or monitor treatment.     Imaging: Ct Head Code Stroke W/o Cm  11/17/15  CLINICAL DATA:  Code stroke.  Slurred speech.  Facial droop. EXAM: CT HEAD WITHOUT CONTRAST TECHNIQUE: Contiguous axial images were obtained from the base of the skull through the vertex without intravenous contrast. COMPARISON:  None. FINDINGS: There is a large volume of acute subarachnoid hemorrhage throughout the basilar cisterns, bilateral sylvian fissures and bilateral frontal convexity sulci. There is intraparenchymal hemorrhage in the central midbrain.  There is intraventricular hemorrhage in the third and fourth ventricles. There is symmetric mild dilatation of the temporal horns of the lateral ventricles. No hemorrhage is seen within the lateral ventricles. No midline shift. There is a nonspecific periventricular and subcortical white matter hypodensity throughout both cerebral hemispheres. No CT evidence of acute transcortical infarction. Cerebral volume is age appropriate. Mucoperiosteal thickening in the bilateral inferior maxillary sinuses and bilateral ethmoidal air cells. No fluid levels in the paranasal sinuses. The mastoid air cells are unopacified. No evidence of calvarial fracture. IMPRESSION: 1. Large volume acute subarachnoid hemorrhage throughout the basilar cisterns, bilateral sylvian fissures and bilateral frontal convexity sulci. Intraparenchymal hemorrhage in the central midbrain. No midline shift. Findings are  most worrisome for ruptured aneurysm or brainstem vascular malformation. 2. Intraventricular hemorrhage in the third and fourth ventricles. Mild dilatation of the temporal horns of the lateral ventricles suggesting early acute hydrocephalus. 3. Nonspecific periventricular and subcortical white matter hypodensity throughout both cerebral hemispheres, considerations include transependymal CSF flow and/or chronic small vessel ischemia. Critical Value/emergent results were called by telephone at the time of interpretation on 11-25-15 at 9:18 am to Dr. Patsey Berthold , who verbally acknowledged these results. Electronically Signed   By: Delbert Phenix M.D.   On: 11-25-15 09:27      Assessment and plan:   DONNIE GEDEON is an 65 y.o. female patient who presented with Severe acute onset headache and nausea and vomiting this morning around 7 AM. she woke up normal prior to that up at about 5:30 AM. He had severely elevated systolic blood pressures of 260 at home. CT of the head showed brainstem hemorrhage with extensive large areas of  subarachnoid hemorrhage involving all of her basal cisterns, bilateral sylvian fissures and also involving some of the cortical sulci.  She is currently intubated, sedated on propofol. Pupils and corneal's and nonreactive, with no motor response.  For blood pressure control recommend switching to clevidipine drip with a goal systolic blood pressures between 110 to 130. Ordered Keppra 1500 mg every 12 hours for seizure prophylaxis. There was some concern for seizure-like activity prior to intubation in the ER.  To help with potential brain edema secondary to subarachnoid hemorrhage, mannitol 25 g was ordered. Her sodium level was 139. Also ordered 3% sodium chloride 30 mL an hour with every 6 hours serum osm and sodium checks. Patient will be admitted to neuro ICU.  EEG was performed which showed evidence of encephalopathy, no seizures noted.  Consulted neurosurgery, no interventional procedures recommended at this time. Pulmonary critical care medicine team was also consulted for ventilator management.  Given the severity of the subarachnoid hemorrhage, the prognosis appears to be very poor for survival.  Reviewed the CT images with the patient's husband and daughter, discussed the prognosis in detail.

## 2015-11-27 NOTE — ED Notes (Signed)
Woke up at 0530 like normal; at 0700 had onset of a headache and vomiting. EMS arrived and found her diaphoretic and BP 270/130. No weakness or physical deficits, only headache. Arrives A&O reporting severe headache. All extremities strong, face symmetrical, speech in tact.

## 2015-11-27 NOTE — ED Notes (Signed)
RT at bedside inserting arterial line.

## 2015-11-27 NOTE — Progress Notes (Signed)
1845 patient heart stopped. A second RN and I auscultated no heart and lung sounds at this time. Notified MD of time of death. Notified ME to make sure patient was not a ME case and she wasn't. Family in room at the time of death.

## 2015-11-27 NOTE — ED Notes (Signed)
Activated Code Stroke @ 8:52a

## 2015-11-27 NOTE — Procedures (Signed)
  Patient Active Problem List   Diagnosis Date Noted  . 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 SAH (subarachnoid hemorrhage) (HCC)   . Acute bronchitis 10/18/2015  . Essential hypertension, benign 10/27/2013  . Pure hypercholesterolemia 10/27/2013  . S/P laparoscopic cholecystectomy Sept 2014 06/06/2013  . Cholecystitis, acute 05/19/2013  . Allergic rhinitis 04/27/2010  . 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  0.9 %  sodium chloride infusion, , Intravenous, Continuous, Storm FriskPatrick E Wright, MD, Stopped at 14-May-2016 1730 .  acetaminophen (TYLENOL) tablet 650 mg, 650 mg, Oral, Q4H PRN **OR** acetaminophen (TYLENOL) suppository 650 mg, 650 mg, Rectal, Q4H PRN, Kimeka Badour Daniel NonesNarayan Kaveer Jerianne Anselmo, MD, 650 mg at 14-May-2016 1646 .  antiseptic oral rinse solution (CORINZ), 7 mL, Mouth Rinse, 10 times per day, Micki RileyPramod S Sethi, MD, 7 mL at 14-May-2016 1541 .  arformoterol (BROVANA) nebulizer solution 15 mcg, 15 mcg, Nebulization, BID, Leslye Peerobert S Byrum, MD .  budesonide (PULMICORT) nebulizer solution 0.25 mg, 0.25 mg, Nebulization, BID, Leslye Peerobert S Byrum, MD .  chlorhexidine gluconate (PERIDEX) 0.12 % solution 15 mL, 15 mL, Mouth Rinse, BID, Micki RileyPramod S Sethi, MD, 15 mL at 14-May-2016 1308 .  clevidipine (CLEVIPREX) infusion 0.5 mg/mL, 0-21 mg/hr, Intravenous, Continuous, Shanielle Correll Daniel NonesNarayan Kaveer Sufyan Meidinger, MD, Stopped at 14-May-2016 1730 .  docusate (COLACE) 50 MG/5ML liquid 100 mg, 100 mg, Per Tube, BID PRN, Leslye Peerobert S Byrum, MD .  fentaNYL (SUBLIMAZE) injection 100 mcg, 100 mcg, Intravenous, Q15 min PRN, Leslye Peerobert S Byrum, MD .  fentaNYL (SUBLIMAZE) injection 100 mcg, 100 mcg, Intravenous, Q2H PRN, Leslye Peerobert S Byrum, MD, 100 mcg at 14-May-2016 1509 .  hydrALAZINE (APRESOLINE) injection 10 mg, 10 mg, Intravenous, Q6H PRN, Storm FriskPatrick E Wright, MD, 10 mg at 14-May-2016 1638 .  levETIRAcetam (KEPPRA) 1,500 mg in sodium chloride 0.9 % 100 mL IVPB,  1,500 mg, Intravenous, Q12H, Mariacristina Aday Daniel NonesNarayan Kaveer Sahand Gosch, MD, Stopped at 14-May-2016 1019 .  levothyroxine (SYNTHROID, LEVOTHROID) injection 37.5 mcg, 37.5 mcg, Intravenous, QAC breakfast, Leslye Peerobert S Byrum, MD .  morphine 250 mg in dextrose 5 % 250 mL (1 mg/mL) infusion, 10 mg/hr, Intravenous, Continuous, Storm FriskPatrick E Wright, MD .  morphine bolus via infusion 5-20 mg, 5-20 mg, Intravenous, Q15 min PRN, Storm FriskPatrick E Wright, MD .  NiMODipine (NYMALIZE) 60 MG/20ML oral solution 60 mg, 60 mg, Oral, Q4H, Antia Rahal Daniel NonesNarayan Kaveer Adryel Wortmann, MD, 60 mg at 14-May-2016 1309 .  norepinephrine (LEVOPHED) 4 mg in dextrose 5 % 250 mL (0.016 mg/mL) infusion, 0-40 mcg/min, Intravenous, Titrated, Rylie Limburg Daniel NonesNarayan Kaveer Heiress Williamson, MD .  pantoprazole (PROTONIX) injection 40 mg, 40 mg, Intravenous, Daily, Leslye Peerobert S Byrum, MD .  propofol (DIPRIVAN) 1000 MG/100ML infusion, 0-50 mcg/kg/min, Intravenous, Continuous, Leslye Peerobert S Byrum, MD, Stopped at 14-May-2016 1730 .  propofol (DIPRIVAN) 1000 MG/100ML infusion, , , ,  .  senna-docusate (Senokot-S) tablet 1 tablet, 1 tablet, Oral, BID, Miyoshi Ligas Daniel NonesNarayan Kaveer Ximena Todaro, MD   Introduction:  This is a 19 channel routine scalp EEG performed at the bedside with bipolar and monopolar montages arranged in accordance to the international 10/20 system of electrode placement. One channel was dedicated to EKG recording.   Findings:  Background activity is predominantly consisting of generalized slowing in the low to mid theta range.  No definite evidence of abnormal epileptiform discharges or electrographic seizures were noted during this recording.   Impression:  Abnormal routine inpatient EEG suggestive of encephalopathy. Clinical correlation is recommended .

## 2015-11-27 NOTE — ED Notes (Signed)
Two RN attempted OG/NG placement with intubation unsuccessful. Will notify MD.

## 2015-11-27 NOTE — ED Notes (Signed)
Neuro surgery at bedside.

## 2015-11-27 NOTE — Progress Notes (Signed)
Bedside EEG completed, results pending. 

## 2015-11-27 NOTE — ED Provider Notes (Addendum)
CSN: [id number]     Arrival date & time    History   First MD Initiated Contact with Patient December 04, 2015 0844     Chief Complaint  Patient presents with  . Headache  . Emesis   Level V caveat due to acuity of condition.  (Consider location/radiation/quality/duration/timing/severity/associated sxs/prior Treatment) HPI  Elizabeth Newman is a 65 year old female with a history of hypertension who presents today with an acute episode of altered mental status at 7:15. Her husband states that she was up this morning in her usual state of health. He was up with her at about 6:30. They were speaking and interacting. At about 7:15 she went into the restroom. He then heard some noises from the restroom and went in to find her standing but stating that she was unable to see or to walk. He carried her to the sofa and called 911. She is able to tell me that she has a headache. She is unable to provide much more history. Family reports that she had an episode of vomiting to [place] my coming in the room.  Past Medical History  Diagnosis Date  . Hypothyroidism   . HTN (hypertension)   . Chronic obstructive asthma   . Allergic rhinitis   . Hyperlipidemia     high cholesterol. Elevated LFT's on statin  . Osteoporosis     BMD 2009  . Anxiety and depression   . Vitamin D deficiency   . History of nuclear stress test 2012    No Ischemia   Past Surgical History  Procedure Laterality Date  . Back surgery  1993 and 2000  . Cholecystectomy N/A 05/20/2013    Procedure: LAPAROSCOPIC CHOLECYSTECTOMY WITH INTRAOPERATIVE CHOLANGIOGRAM;  Surgeon: Valarie Merino, MD;  Location: [facility];  Service: General;  Laterality: N/A;   Family History  Problem Relation Age of Onset  . Asthma Mother   . Asthma Paternal Grandfather   . Emphysema Mother   . Hypertension Father   . Bone cancer Maternal Grandfather   . Heart disease Maternal Grandmother   . Heart disease Paternal Grandmother   . Allergies Mother    Social  History  Substance Use Topics  . Smoking status: Former Smoker -- 0.30 packs/day for 30 years    Types: Cigarettes    Quit date: 04/05/2011  . Smokeless tobacco: None  . Alcohol Use: No   OB History    No data available     Review of Systems  Unable to perform ROS: Acuity of condition      Allergies  Contrast media; Shellfish allergy; and Betadine  Home Medications   Prior to Admission medications   Medication Sig Start Date End Date Taking? Authorizing Provider  albuterol (VENTOLIN HFA) 108 (90 BASE) MCG/ACT inhaler Inhale 2 puffs into the lungs every 6 (six) hours as needed for wheezing. 08/12/15 11/28/16  Coralyn Helling, MD  ALPRAZolam Prudy Feeler) 0.5 MG tablet Take 0.5 mg by mouth as needed for anxiety.  10/04/11   Historical Provider, MD  Chlorpheniramine-DM (CORICIDIN HBP COUGH/COLD PO) Take 1 capsule by mouth as directed. Reported on 11/01/2015    Historical Provider, MD  colesevelam [facility]) 625 MG tablet Take 1,875 mg by mouth 2 (two) times daily with a meal.    Historical Provider, MD  fluticasone (FLONASE) 50 MCG/ACT nasal spray Place 2 sprays into both nostrils daily. 10/18/15   Leslye Peer, MD  levothyroxine (SYNTHROID, LEVOTHROID) 75 MCG tablet Take 75 mcg by mouth daily before breakfast.  Historical Provider, MD  metoprolol (LOPRESSOR) 50 MG tablet Take 50 mg by mouth daily.     Historical Provider, MD  mometasone-formoterol (DULERA) 100-5 MCG/ACT AERO Inhale 2 puffs into the lungs 2 (two) times daily. 08/12/15   Coralyn Helling, MD   BP 268/108 mmHg  Pulse 119  Temp(Src) 96.8 F (36 C) (Rectal)  Resp 17  Ht  (1.676 m)  SpO2 96% Physical Exam  Constitutional: She appears well-developed and well-nourished. She appears distressed.  Patient is extremely hypertensive at 229/113  HENT:  Head: Normocephalic and atraumatic.  Right Ear: External ear normal.  Left Ear: External ear normal.  Nose: Nose normal.  Mouth/Throat: Oropharynx is clear and moist.  Eyes: Pupils  are equal, round, and reactive to light.  2. Approximately 3-4 mm bilaterally with some responsiveness.  Neck: Normal range of motion. Neck supple.  Cardiovascular: Normal rate, regular rhythm and normal heart sounds.   Pulmonary/Chest: Effort normal and breath sounds normal.  Abdominal: Soft. Bowel sounds are normal.  Musculoskeletal: Normal range of motion.  Neurological: She displays normal reflexes. No cranial nerve deficit. She exhibits normal muscle tone.  Patient opens eyes and responds verbally to sternal rub. She has decreased strength in her right upper extremity with palmar drift. Left upper extremity is 5 out of 5 strength throughout. She is able to move both of her lower extremities and hold up against gravity. Reflexes are equal throughout.  Skin: Skin is warm and dry.  Nursing note and vitals reviewed.   ED Course  Procedures (including critical care time) Labs Review Labs Reviewed  CBC - Abnormal; Notable for the following:    WBC 16.3 (*)    Hemoglobin 15.2 (*)    All other components within normal limits  DIFFERENTIAL - Abnormal; Notable for the following:    Neutro Abs 10.6 (*)    Monocytes Absolute 1.1 (*)    All other components within normal limits  I-STAT CHEM 8, ED - Abnormal; Notable for the following:    Glucose, Bld 227 (*)    Calcium, Ion 1.07 (*)    Hemoglobin 20.7 (*)    HCT 61.0 (*)    All other components within normal limits  COMPREHENSIVE METABOLIC PANEL  ETHANOL  PROTIME-INR  APTT  URINE RAPID DRUG SCREEN, HOSP PERFORMED  URINALYSIS, ROUTINE W REFLEX MICROSCOPIC (NOT AT [facility])  I-STAT TROPOININ, ED  I-STAT TROPOININ, ED    Imaging Review Ct Head Code Stroke W/o Cm  25-Nov-2015  CLINICAL DATA:  Code stroke.  Slurred speech.  Facial droop. EXAM: CT HEAD WITHOUT CONTRAST TECHNIQUE: Contiguous axial images were obtained from the base of the skull through the vertex without intravenous contrast. COMPARISON:  None. FINDINGS: There is a large  volume of acute subarachnoid hemorrhage throughout the basilar cisterns, bilateral sylvian fissures and bilateral frontal convexity sulci. There is intraparenchymal hemorrhage in the central midbrain. There is intraventricular hemorrhage in the third and fourth ventricles. There is symmetric mild dilatation of the temporal horns of the lateral ventricles. No hemorrhage is seen within the lateral ventricles. No midline shift. There is a nonspecific periventricular and subcortical white matter hypodensity throughout both cerebral hemispheres. No CT evidence of acute transcortical infarction. Cerebral volume is age appropriate. Mucoperiosteal thickening in the bilateral inferior maxillary sinuses and bilateral ethmoidal air cells. No fluid levels in the paranasal sinuses. The mastoid air cells are unopacified. No evidence of calvarial fracture. IMPRESSION: 1. Large volume acute subarachnoid hemorrhage throughout the basilar cisterns, bilateral sylvian fissures  and bilateral frontal convexity sulci. Intraparenchymal hemorrhage in the central midbrain. No midline shift. Findings are most worrisome for ruptured aneurysm or brainstem vascular malformation. 2. Intraventricular hemorrhage in the third and fourth ventricles. Mild dilatation of the temporal horns of the lateral ventricles suggesting early acute hydrocephalus. 3. Nonspecific periventricular and subcortical white matter hypodensity throughout both cerebral hemispheres, considerations include transependymal CSF flow and/or chronic small vessel ischemia. Critical Value/emergent results were called by telephone at the time of interpretation on 10/09/2015 at 9:18 am to Dr. Patsey BertholdAM NANDIGAM , who verbally acknowledged these results. Electronically Signed   By: Delbert PhenixJason A Poff M.D.   On: 002/06/2016 09:27   I have personally reviewed and evaluated these images and lab results as part of my medical decision-making.   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 Code stroke initiated upon my initial evaluation. CT obtained is significant for subarachnoid hemorrhage. On arrival back from CT patient had an episode of staring facial twitching followed becoming unresponsive. At this time it was opted to intubate patient for airway control. Please see my intubation note. Patient is started on Cardene for blood pressure control. She is given a gram of Keppra due to the probability that she had a seizure. I have been consulting with Dr. Arta SilenceNandagam on call for neuro hospitalist. He is also at the bedside. We have discussed the course with the family. I have also consulted with critical care for ventilator management.  Discussed with Dr. Lovell SheehanJenkins, on-call for neurosurgery. He is at bedside and is discussing care with family.  CRITICAL CARE Performed by: Hilario QuarryAY,Darryl Blumenstein S Total critical care time: 70 minutes Critical care time was exclusive of separately billable procedures and treating other patients. Critical care was necessary to treat or prevent imminent or life-threatening deterioration. Critical care was time spent personally by me on the following activities: development of treatment plan with patient and/or surrogate as well as nursing, discussions with consultants, evaluation of patient's response to treatment, examination of patient, obtaining history from patient or surrogate, ordering and performing treatments and interventions, ordering and review of laboratory studies,  ordering and review of radiographic studies, pulse oximetry and re-evaluation of patient's condition.   Elizabeth Grizzleanielle Sireen Halk, MD 05/03/2016 [id number]  Elizabeth Grizzleanielle Maycie Luera, MD 05/03/2016 1014

## 2015-11-27 NOTE — Progress Notes (Addendum)
eLink Physician-Brief Progress Note Patient Name: Elizabeth GovernKathy L Newman DOB: 10/06/1950 MRN: [id number]   Date of Service  12-10-15  HPI/Events of Note  HTN poorly controlled.  eICU Interventions  Add scheduled hydralazine     Intervention Category Major Interventions: Hypertension - evaluation and management  Shan Levansatrick Wright 12-10-15, 3:58 PM

## 2015-11-27 NOTE — Progress Notes (Signed)
Chaplain approached family shortly after patient was brought to [facility].  A large family was gathered in the waiting area (perhaps 15-20 people). The mood was fairly serious and grim. Chaplain offered services which family politely declined.  Chaplain appreciated ED sec. Candace's f/u regarding patient's transfer to the floor.  Please call if the patient or family needs any support.   Theodoro Parmaalacios, [place] Hawker N, Chaplain [phone]   02/12/16 1205  Clinical Encounter Type  Visited With Family  Visit Type Initial  Referral From (ED Secretary)  Consult/Referral To Chaplain  Stress Factors  Family Stress Factors Health changes

## 2015-11-27 NOTE — Consult Note (Signed)
Reason for Consult: Subarachnoid hemorrhage, brain stem hemorrhage Referring Physician: Dr. Kaylyn Layer is an 65 y.o. female.  HPI: The patient is a 65 year old white female who was in usual state of good health this morning. The patient called out to her husband and said that she couldn't breathe, couldn't walk and couldn't see. The patient's husband called EMS and she was brought to [facility] as a code stroke. I reports she was initially following commands. She was emergently taken to the CAT scanner which demonstrated a large subarachnoid hemorrhage and a brainstem hemorrhage. The patient had a decline in her mental status and was intubated. The neural hospitalist have seen her and admitted her. A neurosurgical consultation is requested.  Presently the patient is intubated and sedated. She was intubated approximately an hour ago. She is in no apparent distress. Her husband is at the bedside.  Past Medical History  Diagnosis Date  . Hypothyroidism   . HTN (hypertension)   . Chronic obstructive asthma   . Allergic rhinitis   . Hyperlipidemia     high cholesterol. Elevated LFT's on statin  . Osteoporosis     BMD 2009  . Anxiety and depression   . Vitamin D deficiency   . History of nuclear stress test 2012    No Ischemia    Past Surgical History  Procedure Laterality Date  . Back surgery  1993 and 2000  . Cholecystectomy N/A 05/20/2013    Procedure: LAPAROSCOPIC CHOLECYSTECTOMY WITH INTRAOPERATIVE CHOLANGIOGRAM;  Surgeon: Pedro Earls, MD;  Location: [facility];  Service: General;  Laterality: N/A;    Family History  Problem Relation Age of Onset  . Asthma Mother   . Asthma Paternal Grandfather   . Emphysema Mother   . Hypertension Father   . Bone cancer Maternal Grandfather   . Heart disease Maternal Grandmother   . Heart disease Paternal Grandmother   . Allergies Mother     Social History:  reports that she quit smoking about 4 years ago. Her smoking  use included Cigarettes. She has a 9 pack-year smoking history. She does not have any smokeless tobacco history on file. She reports that she does not drink alcohol or use illicit drugs.  Allergies:  Allergies  Allergen Reactions  . Contrast Media [Iodinated Diagnostic Agents] Anaphylaxis  . Shellfish Allergy Swelling  . Betadine [Povidone Iodine] Other (See Comments)    Not sure if has allergy, doesn't use because of contrast allergy    Medications:  I have reviewed the patient's current medications. Prior to Admission:  (Not in a hospital admission) Scheduled: . NiMODipine  60 mg Oral Q4H  . pantoprazole (PROTONIX) IV  40 mg Intravenous Daily  . senna-docusate  1 tablet Oral BID   Continuous: . clevidipine 5 mg/hr (12-02-15 1010)  . levETIRAcetam 1,500 mg (12-02-15 1004)  . propofol (DIPRIVAN) infusion 5 mcg/kg/min (2015-12-02 0939)  . propofol     RKY:[id number] **OR** acetaminophen, docusate, etomidate, fentaNYL (SUBLIMAZE) injection, fentaNYL (SUBLIMAZE) injection, labetalol, rocuronium Anti-infectives    None       Results for orders placed or performed during the hospital encounter of 2015/12/02 (from the past 48 hour(s))  CBC     Status: Abnormal   Collection Time: Dec 02, 2015  8:27 AM  Result Value Ref Range   WBC 16.3 (H) 4.0 - 10.5 K/uL   RBC 4.83 3.87 - 5.11 MIL/uL   Hemoglobin 15.2 (H) 12.0 - 15.0 g/dL   HCT 45.5 36.0 - 46.0 %  MCV 94.2 78.0 - 100.0 fL   MCH 31.5 26.0 - 34.0 pg   MCHC 33.4 30.0 - 36.0 g/dL   RDW 12.7 11.5 - 15.5 %   Platelets 256 150 - 400 K/uL  Differential     Status: Abnormal   Collection Time: Nov 21, 2015  8:27 AM  Result Value Ref Range   Neutrophils Relative % 64 %   Neutro Abs 10.6 (H) 1.7 - 7.7 K/uL   Lymphocytes Relative 24 %   Lymphs Abs 4.0 0.7 - 4.0 K/uL   Monocytes Relative 7 %   Monocytes Absolute 1.1 (H) 0.1 - 1.0 K/uL   Eosinophils Relative 4 %   Eosinophils Absolute 0.6 0.0 - 0.7 K/uL   Basophils Relative 1 %    Basophils Absolute 0.1 0.0 - 0.1 K/uL  Comprehensive metabolic panel     Status: Abnormal   Collection Time: 11/21/2015  8:27 AM  Result Value Ref Range   Sodium 139 135 - 145 mmol/L   Potassium 3.4 (L) 3.5 - 5.1 mmol/L   Chloride 104 101 - 111 mmol/L   CO2 22 22 - 32 mmol/L   Glucose, Bld 243 (H) 65 - 99 mg/dL   BUN 8 6 - 20 mg/dL   Creatinine, Ser 0.59 0.44 - 1.00 mg/dL   Calcium 9.2 8.9 - 10.3 mg/dL   Total Protein 7.0 6.5 - 8.1 g/dL   Albumin 3.7 3.5 - 5.0 g/dL   AST 56 (H) 15 - 41 U/L   ALT 35 14 - 54 U/L   Alkaline Phosphatase 80 38 - 126 U/L   Total Bilirubin 0.6 0.3 - 1.2 mg/dL   GFR calc non Af Amer >60 >60 mL/min   GFR calc Af Amer >60 >60 mL/min    Comment: (NOTE) The eGFR has been calculated using the CKD EPI equation. This calculation has not been validated in all clinical situations. eGFR's persistently <60 mL/min signify possible Chronic Kidney Disease.    Anion gap 13 5 - 15  Ethanol     Status: None   Collection Time: November 21, 2015  8:27 AM  Result Value Ref Range   Alcohol, Ethyl (B) <5 <5 mg/dL    Comment:        LOWEST DETECTABLE LIMIT FOR SERUM ALCOHOL IS 5 mg/dL FOR MEDICAL PURPOSES ONLY   I-stat troponin, ED (not at [facility], [facility])     Status: None   Collection Time: 11/21/15  8:33 AM  Result Value Ref Range   Troponin i, poc 0.00 0.00 - 0.08 ng/mL   Comment 3            Comment: Due to the release kinetics of cTnI, a negative result within the first hours of the onset of symptoms does not rule out myocardial infarction with certainty. If myocardial infarction is still suspected, repeat the test at appropriate intervals.   Protime-INR     Status: None   Collection Time: 21-Nov-2015  9:09 AM  Result Value Ref Range   Prothrombin Time 13.4 11.6 - 15.2 seconds   INR 1.00 0.00 - 1.49  APTT     Status: None   Collection Time: 2015-11-21  9:09 AM  Result Value Ref Range   aPTT 27 24 - 37 seconds  I-Stat Chem 8, ED  (not at [facility], [facility])     Status: Abnormal    Collection Time: 11/21/2015  9:12 AM  Result Value Ref Range   Sodium 139 135 - 145 mmol/L   Potassium 3.6 3.5 -  5.1 mmol/L   Chloride 102 101 - 111 mmol/L   BUN 10 6 - 20 mg/dL   Creatinine, Ser 0.60 0.44 - 1.00 mg/dL   Glucose, Bld 227 (H) 65 - 99 mg/dL   Calcium, Ion 1.07 (L) 1.13 - 1.30 mmol/L   TCO2 26 0 - 100 mmol/L   Hemoglobin 20.7 (H) 12.0 - 15.0 g/dL   HCT 61.0 (H) 36.0 - 46.0 %    Ct Head Code Stroke W/o Cm  12-11-15  CLINICAL DATA:  Code stroke.  Slurred speech.  Facial droop. EXAM: CT HEAD WITHOUT CONTRAST TECHNIQUE: Contiguous axial images were obtained from the base of the skull through the vertex without intravenous contrast. COMPARISON:  None. FINDINGS: There is a large volume of acute subarachnoid hemorrhage throughout the basilar cisterns, bilateral sylvian fissures and bilateral frontal convexity sulci. There is intraparenchymal hemorrhage in the central midbrain. There is intraventricular hemorrhage in the third and fourth ventricles. There is symmetric mild dilatation of the temporal horns of the lateral ventricles. No hemorrhage is seen within the lateral ventricles. No midline shift. There is a nonspecific periventricular and subcortical white matter hypodensity throughout both cerebral hemispheres. No CT evidence of acute transcortical infarction. Cerebral volume is age appropriate. Mucoperiosteal thickening in the bilateral inferior maxillary sinuses and bilateral ethmoidal air cells. No fluid levels in the paranasal sinuses. The mastoid air cells are unopacified. No evidence of calvarial fracture. IMPRESSION: 1. Large volume acute subarachnoid hemorrhage throughout the basilar cisterns, bilateral sylvian fissures and bilateral frontal convexity sulci. Intraparenchymal hemorrhage in the central midbrain. No midline shift. Findings are most worrisome for ruptured aneurysm or brainstem vascular malformation. 2. Intraventricular hemorrhage in the third and fourth ventricles.  Mild dilatation of the temporal horns of the lateral ventricles suggesting early acute hydrocephalus. 3. Nonspecific periventricular and subcortical white matter hypodensity throughout both cerebral hemispheres, considerations include transependymal CSF flow and/or chronic small vessel ischemia. Critical Value/emergent results were called by telephone at the time of interpretation on 12-11-2015 at 9:18 am to Dr. Audria Nine , who verbally acknowledged these results. Electronically Signed   By: Ilona Sorrel M.D.   On: 11-Dec-2015 09:27    ROS: Unobtainable Blood pressure 156/83, pulse 79, temperature 95.2 F (35.1 C), temperature source Core (Comment), resp. rate 16, height 5' 5"  (1.651 m), SpO2 98 %. Physical Exam  General: A comatose, intubated, sedated, 64 year old white female in no apparent distress  HEENT: Normocephalic, atraumatic, pupils are approximately 6 mm on the left and 5 mm on the right. Her pupils are nonreactive bilaterally. There is no corneal reflexes bilaterally  Neck: No obvious abnormalities  Thorax: Symmetric  Abdomen: Soft  Extremities: Unremarkable  Neurologic exam: The patient is Glasgow Coma Scale 3 intubated and sedated. Her pupils are as above. She has no corneal reflexes bilaterally. She does intermittently cough. She has no response to painful stimuli.  I reviewed the patient's head CT performed at [facility] today. It demonstrates a thick diffuse subarachnoid hemorrhage. She has a hemorrhage in her midbrain. There is no significant hydrocephalus yet.  Assessment/Plan: Grade 5 subarachnoid hemorrhage, brain stem hemorrhage: I have discussed the situation with the patient's husband and daughter. Unfortunately, I don't think this is likely to be a survivable injury. I recommended that we continue supportive care. If she shows signs of significant neurologic recovery then we would proceed with a workup including a cerebral arteriogram. They understand the  prognosis is grim. I have answered all her questions. If she were to  develop hydrocephalus we can consider a ventriculostomy ,again if she shows signs of neurologic recovery.  Daziya Redmond D 12-04-2015, 10:23 AM

## 2015-11-27 DEATH — deceased

## 2018-03-17 IMAGING — CT CT HEAD CODE STROKE
2 series · 15 of 30 positions shown, 17 images · non-contrast
Comparison: None.

CLINICAL DATA: Code stroke.  Slurred speech.  Facial droop.

EXAM:
CT HEAD WITHOUT CONTRAST
TECHNIQUE: Contiguous axial images were obtained from the base of the skull
through the vertex without intravenous contrast.

[Series 2: head without · axial · non-contrast · 0.41mm/px · z∈[+1408,+1528]mm · 7 of 34 slices shown, 9 images]
[im 5/34  brain]
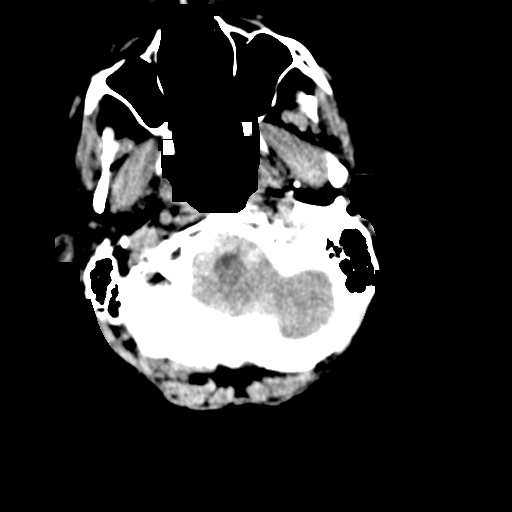
[im 5/34  bone]
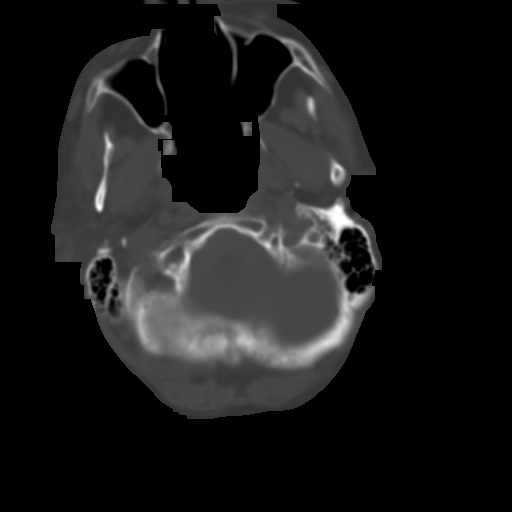
[im 9/34  brain]
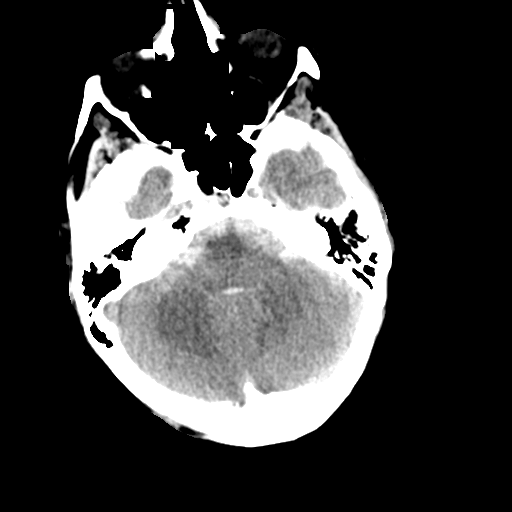
[im 13/34  brain]
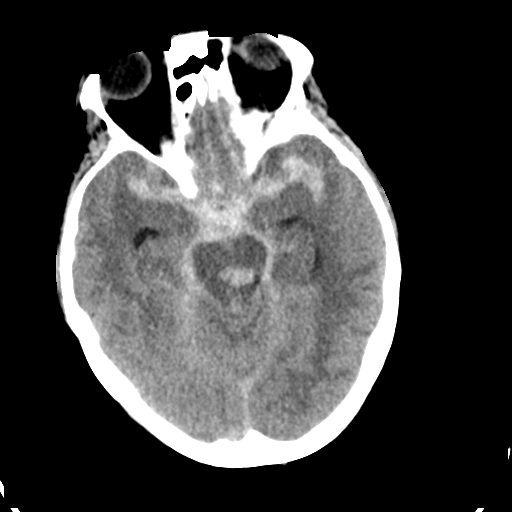
[im 17/34  brain]
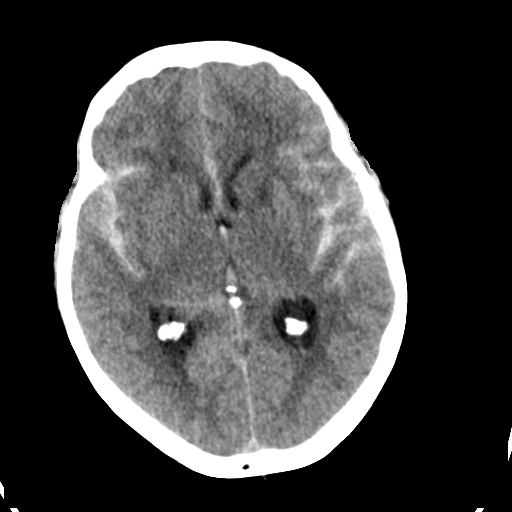
[im 21/34  brain]
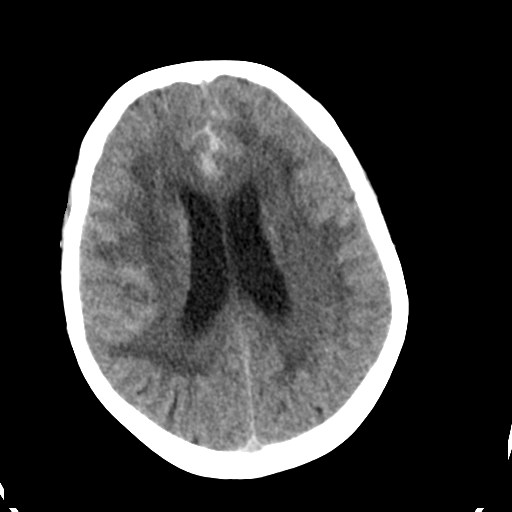
[im 21/34  bone]
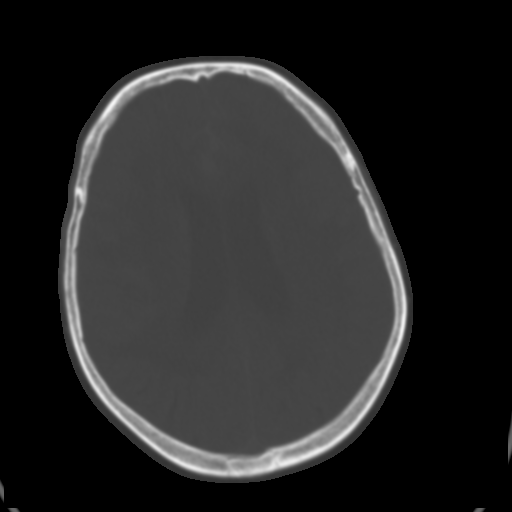
[im 25/34  brain]
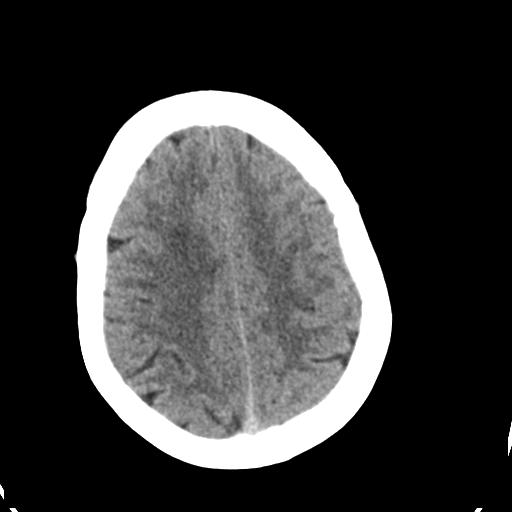
[im 29/34  brain]
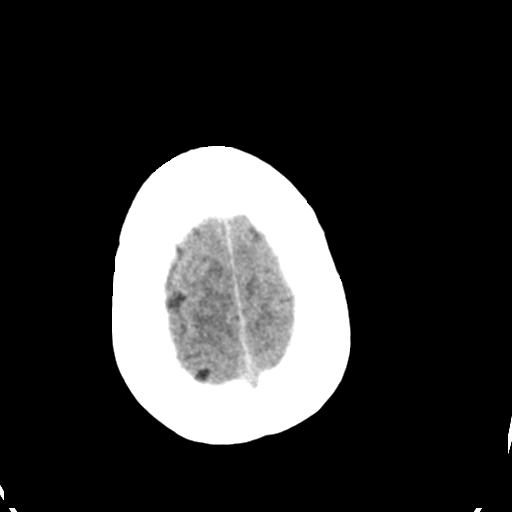

[Series 3: head bone · axial · 0.41mm/px · z∈[+1404,+1536]mm · 8 of 84 slices shown]
[im 9/84  bone]
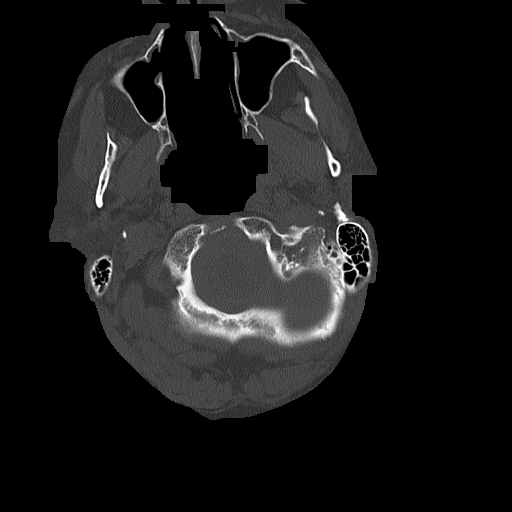
[im 17/84  bone]
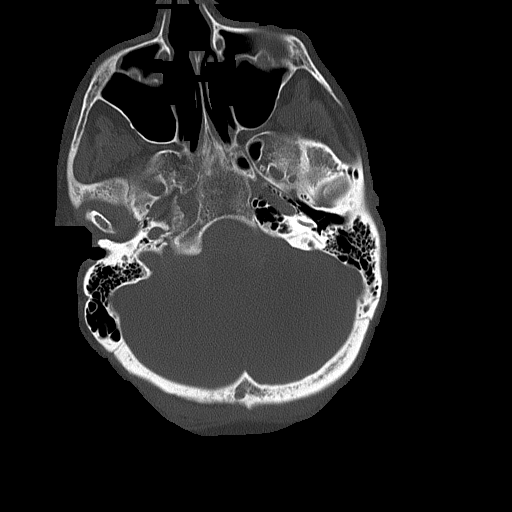
[im 25/84  bone]
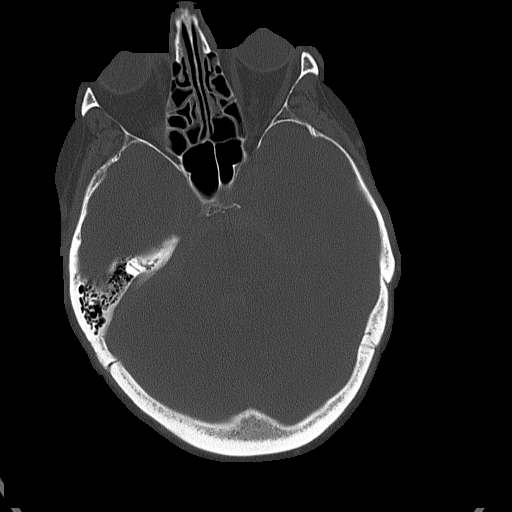
[im 38/84  bone]
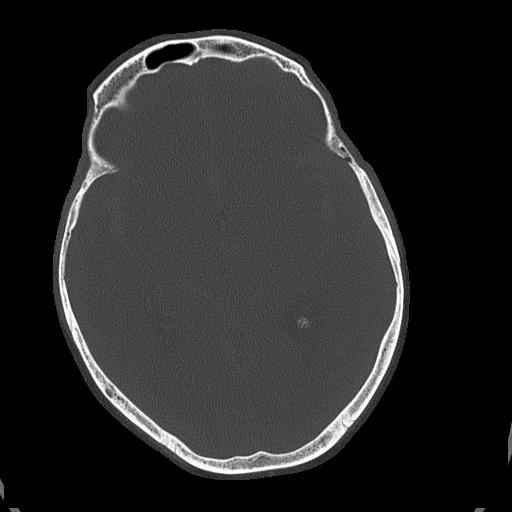
[im 46/84  bone]
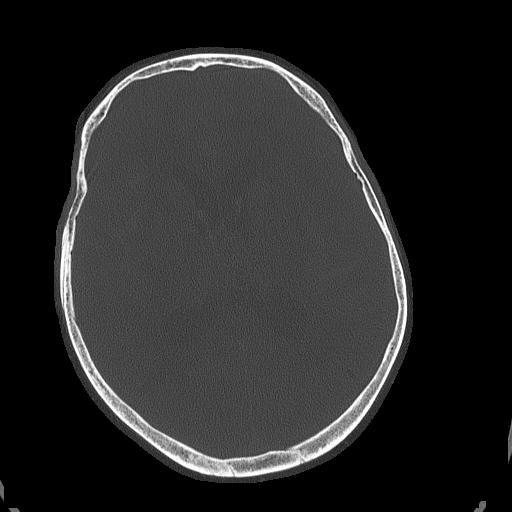
[im 59/84  bone]
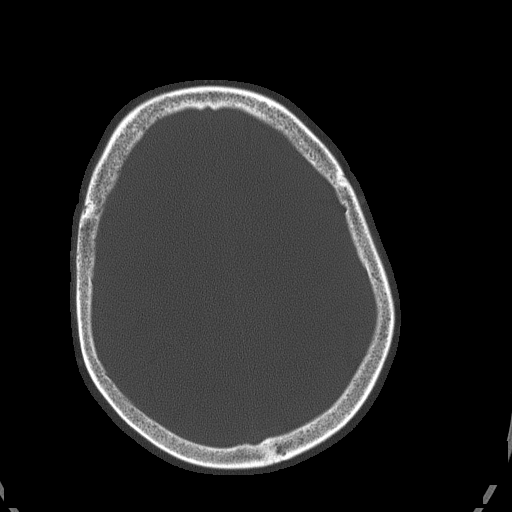
[im 67/84  bone]
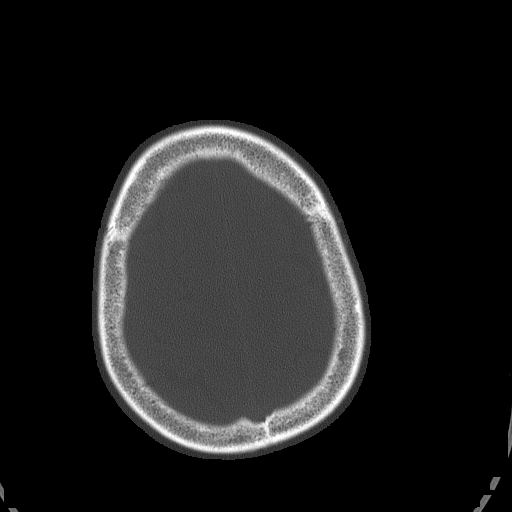
[im 75/84  bone]
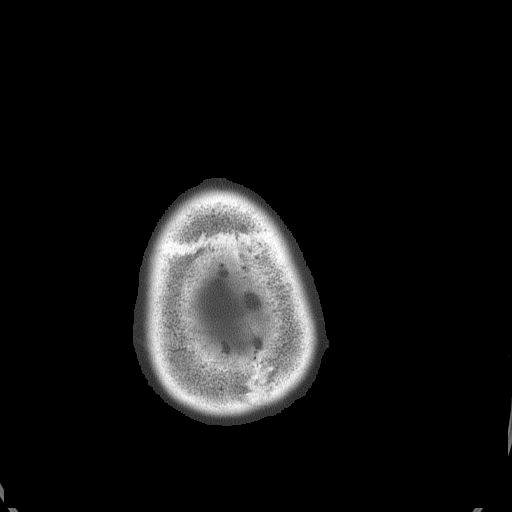

[15 of 30 positions shown; findings below may reference images not displayed]

FINDINGS: There is a large volume of acute subarachnoid hemorrhage throughout
the basilar cisterns, bilateral sylvian fissures and bilateral
frontal convexity sulci. There is intraparenchymal hemorrhage in the
central midbrain. There is intraventricular hemorrhage in the third
and fourth ventricles. There is symmetric mild dilatation of the
temporal horns of the lateral ventricles. No hemorrhage is seen
within the lateral ventricles. No midline shift. There is a
nonspecific periventricular and subcortical white matter hypodensity
throughout both cerebral hemispheres.

No CT evidence of acute transcortical infarction. Cerebral volume is
age appropriate.

Mucoperiosteal thickening in the bilateral inferior maxillary
sinuses and bilateral ethmoidal air cells. No fluid levels in the
paranasal sinuses. The mastoid air cells are unopacified. No
evidence of calvarial fracture.
IMPRESSION: 1. Large volume acute subarachnoid hemorrhage throughout the basilar
cisterns, bilateral sylvian fissures and bilateral frontal convexity
sulci. Intraparenchymal hemorrhage in the central midbrain. No
midline shift. Findings are most worrisome for ruptured aneurysm or
brainstem vascular malformation.
2. Intraventricular hemorrhage in the third and fourth ventricles.
Mild dilatation of the temporal horns of the lateral ventricles
suggesting early acute hydrocephalus.
3. Nonspecific periventricular and subcortical white matter
hypodensity throughout both cerebral hemispheres, considerations
include transependymal CSF flow and/or chronic small vessel
ischemia.

Critical Value/emergent results were called by telephone at the time
of interpretation on 11/14/2015 at [DATE] to Dr. RUDI JUMPER , who
verbally acknowledged these results.
